# Patient Record
Sex: Female | Born: 1950 | Race: White | Hispanic: No | Marital: Married | Smoking: Never smoker
Health system: Southern US, Community
[De-identification: ages and names within clinical notes are randomized; demographics above are authoritative.]

---

## 2018-04-20 ENCOUNTER — Other Ambulatory Visit: Payer: Self-pay | Admitting: Family Medicine

## 2018-04-20 DIAGNOSIS — N6310 Unspecified lump in the right breast, unspecified quadrant: Secondary | ICD-10-CM

## 2018-04-20 DIAGNOSIS — N6320 Unspecified lump in the left breast, unspecified quadrant: Secondary | ICD-10-CM

## 2018-04-20 DIAGNOSIS — N6489 Other specified disorders of breast: Secondary | ICD-10-CM

## 2018-04-24 ENCOUNTER — Other Ambulatory Visit: Payer: Self-pay | Admitting: Family Medicine

## 2018-04-24 DIAGNOSIS — N63 Unspecified lump in unspecified breast: Secondary | ICD-10-CM

## 2018-04-24 DIAGNOSIS — N6320 Unspecified lump in the left breast, unspecified quadrant: Secondary | ICD-10-CM

## 2018-04-27 ENCOUNTER — Ambulatory Visit
Admission: RE | Admit: 2018-04-27 | Discharge: 2018-04-27 | Disposition: A | Discharge status: Home / Self Care | Payer: Medicare HMO | Source: Ambulatory Visit | Attending: Family Medicine | Admitting: Family Medicine

## 2018-04-27 ENCOUNTER — Other Ambulatory Visit: Payer: Self-pay | Admitting: Family Medicine

## 2018-04-27 DIAGNOSIS — N6320 Unspecified lump in the left breast, unspecified quadrant: Secondary | ICD-10-CM

## 2018-04-27 DIAGNOSIS — N6310 Unspecified lump in the right breast, unspecified quadrant: Secondary | ICD-10-CM

## 2018-04-28 ENCOUNTER — Ambulatory Visit
Admission: RE | Admit: 2018-04-28 | Discharge: 2018-04-28 | Disposition: A | Discharge status: Home / Self Care | Payer: Medicare HMO | Source: Ambulatory Visit | Attending: Family Medicine | Admitting: Family Medicine

## 2018-04-28 ENCOUNTER — Other Ambulatory Visit: Payer: Self-pay | Admitting: Family Medicine

## 2018-04-28 DIAGNOSIS — N63 Unspecified lump in unspecified breast: Secondary | ICD-10-CM

## 2018-04-28 DIAGNOSIS — N6489 Other specified disorders of breast: Secondary | ICD-10-CM

## 2018-04-28 DIAGNOSIS — N6320 Unspecified lump in the left breast, unspecified quadrant: Secondary | ICD-10-CM

## 2018-04-28 DIAGNOSIS — N6310 Unspecified lump in the right breast, unspecified quadrant: Secondary | ICD-10-CM

## 2018-05-05 ENCOUNTER — Ambulatory Visit
Admission: RE | Admit: 2018-05-05 | Discharge: 2018-05-05 | Disposition: A | Discharge status: Home / Self Care | Payer: Medicare HMO | Source: Ambulatory Visit | Attending: Family Medicine | Admitting: Family Medicine

## 2018-05-05 ENCOUNTER — Other Ambulatory Visit: Payer: Self-pay | Admitting: Family Medicine

## 2018-05-05 DIAGNOSIS — N6489 Other specified disorders of breast: Secondary | ICD-10-CM

## 2018-07-03 ENCOUNTER — Telehealth: Payer: Self-pay | Admitting: Hematology & Oncology

## 2018-07-03 ENCOUNTER — Other Ambulatory Visit: Payer: Self-pay

## 2018-07-03 NOTE — Telephone Encounter (Signed)
LMOM FOR PT TO RETURN CALL TO OFFICE TO [facility] NEW PATIENT APPT. APPT LETTER MAILED TO CONFIRM LOCATION/DATE/TIME 08/11/18 AT 1030 AM

## 2018-07-24 ENCOUNTER — Inpatient Hospital Stay: Payer: Medicare HMO | Attending: Hematology & Oncology

## 2018-07-24 ENCOUNTER — Other Ambulatory Visit: Payer: Self-pay

## 2018-07-24 ENCOUNTER — Encounter: Payer: Self-pay | Admitting: Hematology & Oncology

## 2018-07-24 ENCOUNTER — Inpatient Hospital Stay (HOSPITAL_BASED_OUTPATIENT_CLINIC_OR_DEPARTMENT_OTHER): Payer: Medicare HMO | Admitting: Hematology & Oncology

## 2018-07-24 ENCOUNTER — Encounter: Payer: Self-pay | Admitting: *Deleted

## 2018-07-24 VITALS — BP 138/65 | HR 82 | Resp 18 | Ht 61.0 in | Wt 195.0 lb

## 2018-07-24 DIAGNOSIS — Z90710 Acquired absence of both cervix and uterus: Secondary | ICD-10-CM | POA: Diagnosis not present

## 2018-07-24 DIAGNOSIS — Z79899 Other long term (current) drug therapy: Secondary | ICD-10-CM | POA: Insufficient documentation

## 2018-07-24 DIAGNOSIS — Z79811 Long term (current) use of aromatase inhibitors: Secondary | ICD-10-CM | POA: Diagnosis not present

## 2018-07-24 DIAGNOSIS — C50412 Malignant neoplasm of upper-outer quadrant of left female breast: Secondary | ICD-10-CM | POA: Insufficient documentation

## 2018-07-24 DIAGNOSIS — Z170 Estrogen receptor positive status [ER+]: Secondary | ICD-10-CM

## 2018-07-24 DIAGNOSIS — Z7951 Long term (current) use of inhaled steroids: Secondary | ICD-10-CM | POA: Diagnosis not present

## 2018-07-24 DIAGNOSIS — M8589 Other specified disorders of bone density and structure, multiple sites: Secondary | ICD-10-CM

## 2018-07-24 DIAGNOSIS — Z803 Family history of malignant neoplasm of breast: Secondary | ICD-10-CM | POA: Diagnosis not present

## 2018-07-24 DIAGNOSIS — M8580 Other specified disorders of bone density and structure, unspecified site: Secondary | ICD-10-CM | POA: Insufficient documentation

## 2018-07-24 LAB — CBC WITH DIFFERENTIAL (CANCER CENTER ONLY)
Abs Immature Granulocytes: 0.01 10*3/uL (ref 0.00–0.07)
Basophils Absolute: 0.1 10*3/uL (ref 0.0–0.1)
Basophils Relative: 1 %
Eosinophils Absolute: 0.3 10*3/uL (ref 0.0–0.5)
Eosinophils Relative: 4 %
HCT: 41.9 % (ref 36.0–46.0)
Hemoglobin: 13.9 g/dL (ref 12.0–15.0)
Immature Granulocytes: 0 %
Lymphocytes Relative: 22 %
Lymphs Abs: 1.8 10*3/uL (ref 0.7–4.0)
MCH: 33.6 pg (ref 26.0–34.0)
MCHC: 33.2 g/dL (ref 30.0–36.0)
MCV: 101.2 fL — ABNORMAL HIGH (ref 80.0–100.0)
Monocytes Absolute: 0.6 10*3/uL (ref 0.1–1.0)
Monocytes Relative: 8 %
Neutro Abs: 5.3 10*3/uL (ref 1.7–7.7)
Neutrophils Relative %: 65 %
Platelet Count: 277 10*3/uL (ref 150–400)
RBC: 4.14 MIL/uL (ref 3.87–5.11)
RDW: 12.6 % (ref 11.5–15.5)
WBC Count: 8.1 10*3/uL (ref 4.0–10.5)
nRBC: 0 % (ref 0.0–0.2)

## 2018-07-24 LAB — CMP (CANCER CENTER ONLY)
ALT: 17 U/L (ref 0–44)
AST: 16 U/L (ref 15–41)
Albumin: 4.6 g/dL (ref 3.5–5.0)
Alkaline Phosphatase: 100 U/L (ref 38–126)
Anion gap: 10 (ref 5–15)
BUN: 22 mg/dL (ref 8–23)
CO2: 29 mmol/L (ref 22–32)
Calcium: 11.2 mg/dL — ABNORMAL HIGH (ref 8.9–10.3)
Chloride: 101 mmol/L (ref 98–111)
Creatinine: 0.86 mg/dL (ref 0.44–1.00)
GFR, Est AFR Am: >60 mL/min (ref >60)
GFR, Estimated: >60 mL/min (ref >60)
Glucose, Bld: 96 mg/dL (ref 70–99)
Potassium: 3.8 mmol/L (ref 3.5–5.1)
Sodium: 140 mmol/L (ref 135–145)
Total Bilirubin: 0.4 mg/dL (ref 0.3–1.2)
Total Protein: 7.8 g/dL (ref 6.5–8.1)

## 2018-07-24 MED ORDER — LETROZOLE 2.5 MG PO TABS: 2.5000 mg | ORAL_TABLET | Freq: Every day | ORAL | 6 refills | Status: DC

## 2018-07-24 NOTE — Progress Notes (Signed)
.  Initial RN Navigator Patient Visit  Name: Jocelyn Shaw Date of Referral : 07/03/18 Diagnosis: Malignant neoplasm of upper-outer quadrant of left breast in female, estrogen receptor positive ([place])  Met with patient prior to their visit with MD. Hanley Seamen patient "Your Patient Navigator" handout which explains my role, areas in which I am able to help, and all the contact information for myself and the office. Also gave patient MD and Navigator business card. Reviewed with patient the general overview of expected course after initial diagnosis and time frame for all steps to be completed.  Patient completed visit with Dr. Marin Olp  Patient has already completed biopsy, surgery, and referral for radiation. She has been referred here for possible oral therapy. She doesn't require any further work up.  Will continue as needed. Patient knows to reach out to me if any barriers, questions or concerns come up.  Patient understands all follow up procedures and expectations. They have my number to reach out for any further clarification or additional needs. Will call patient in 5-7 days to see if any further needs have presented, or if patient has any further questions or needs.

## 2018-07-24 NOTE — Progress Notes (Signed)
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 New Patient (Initial Visit)  : I had breast cancer in my left breast.  HPI: Jocelyn Shaw is a very nice 68 year old postmenopausal white female.  She is already great-grandmother.  She has 2 great-grandchildren.  There is a strong history of breast cancer in her family.  Her daughter had breast cancer at age 40.  Her mother had breast cancer.  An aunt and sister had breast cancer.  Apparently, her daughter was checked for the BRCA gene and was negative.  Jocelyn Shaw went to her family doctors for a routine visit.  Family doctor noted that the nipple on the left breast was a little bit inverted.  Because of this, a mammogram was ordered.  The mammogram was done 04/16/2018.  In the left breast there was a 1.0 cm mass at the 9 o'clock position.  In the right breast, there is a 1.5 cm indeterminate mass at 11 o'clock position.  She underwent biopsies of both breasts.  Apparently, the biopsies were positive on the left breast and not possible on the right breast.  She then was seen at [facility] and underwent a lumpectomy on 06/09/2018.  The pathology report ([id number]) showed an invasive ductal carcinoma.  The tumor was 1.3 cm in size.  All margins were negative.  She had 2 sentinel lymph nodes that were biopsied and both were negative.  The tumor had a nuclear grade 2 and a glandular tumor grade of 3.  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  The tumor was strongly ER positive and PR positive.  HER-2 negative.  Oncotype testing was then done.  She had a Oncotype score of 16.  She does not want radiation therapy.  She would not take chemotherapy.  She was kindly referred to the [facility] as a niece is 1 of our nurses.  She is doing well.  She feels okay.  There is still  little bit of discomfort with the left breast.  She had her first child when she was 78 years old.  She had a hysterectomy at age 2.  She never smoked.  She does have some osteopenia.  She does take vitamin D and calcium.  Currently, her performance status is ECOG 0.   No past medical history on file.:  :   Current Outpatient Medications:  .  albuterol (PROVENTIL) (2.5 MG/3ML) 0.083% nebulizer solution, Inhale into the lungs., Disp: , Rfl:  .  allopurinol (ZYLOPRIM) 300 MG tablet, Take 300 mg by mouth daily., Disp: , Rfl:  .  azithromycin (ZITHROMAX) 250 MG tablet, Take 2 tablets (500 mg) on  Day 1,  followed by 1 tablet (250 mg) once daily on Days 2 through 5., Disp: , Rfl:  .  bimatoprost (LUMIGAN) 0.01 % SOLN, nightly. One drop in each eye nightly., Disp: , Rfl:  .  brimonidine (ALPHAGAN P) 0.1 % SOLN, 1 drop 3 (three) times a day., Disp: , Rfl:  .  brinzolamide (AZOPT) 1 % ophthalmic suspension, 1 drop 3 (three) times a day., Disp: , Rfl:  .  Brinzolamide-Brimonidine 1-0.2 % SUSP, 1 drop 3 (three) times a day., Disp: , Rfl:  .  budesonide-formoterol (SYMBICORT) 160-4.5 MCG/ACT inhaler, Frequency:   Dosage:0.0     Instructions:  Note:, Disp: , Rfl:  .  calcipotriene (DOVONOX) 0.005 % cream, , Disp: , Rfl:  .  Calcium Carbonate-Vitamin D3 600-400 MG-UNIT TABS, Take by mouth., Disp: , Rfl:  .  fluticasone (FLOVENT DISKUS) 50 MCG/BLIST diskus inhaler, Inhale into the lungs., Disp: , Rfl:  .  fluticasone furoate-vilanterol (BREO ELLIPTA) 100-25 MCG/INH AEPB, INHALE 1 PUFF BY MOUTH EVERY DAY, Disp: , Rfl:  .  hydrochlorothiazide (HYDRODIURIL) 12.5 MG tablet, Take by mouth., Disp: , Rfl:  .  HYDROcodone-acetaminophen (NORCO/VICODIN) 5-325 MG tablet, TAKE 1 TABLET BY MOUTH EVERY 6 (SIX) HOURS AS NEEDED FOR UP TO 5 DAYS., Disp: , Rfl:  .  HYDROcodone-homatropine (HYCODAN) 5-1.5 MG/5ML syrup, TAKE 2.5 MLS BY MOUTH EVERY 8 (EIGHT) HOURS AS NEEDED FOR UP TO 5 DAYS., Disp: , Rfl:  .   hydrOXYzine (ATARAX/VISTARIL) 25 MG tablet, Take by mouth., Disp: , Rfl:  .  KRILL OIL PO, , Disp: , Rfl:  .  losartan (COZAAR) 100 MG tablet, TAKE 1 TABLET BY MOUTH DAILY, Disp: , Rfl:  .  montelukast (SINGULAIR) 10 MG tablet, Take by mouth., Disp: , Rfl:  .  Multiple Vitamin (MULTI-VITAMINS) TABS, Take by mouth., Disp: , Rfl:  .  Omega-3 Fatty Acids (OMEGA 3 PO), Take by mouth., Disp: , Rfl:  .  Omeprazole 20 MG TBDD, Take by mouth., Disp: , Rfl:  .  ranitidine (ZANTAC) 150 MG tablet, Frequency:   Dosage:0.0     Instructions:  Note:, Disp: , Rfl:  .  tiotropium (SPIRIVA) 18 MCG inhalation capsule, Frequency:   Dosage:0.0     Instructions:  Note:, Disp: , Rfl:  .  valACYclovir (VALTREX) 1000 MG tablet, Take by mouth., Disp: , Rfl: :  :  Allergies  Allergen Reactions  . Penicillins Rash and Swelling  . Levofloxacin Other (See Comments) and Swelling    Pt states causes joint pain   . Doxycycline Hyclate Rash  . Sulfa Antibiotics Rash  :  No family history on file.:  Social History   Socioeconomic History  . Marital status: Married    Spouse name: Not on file  . Number of children: Not on file  . Years of education: Not on file  . Highest education level: Not on file  Occupational History  . Not on file  Social Needs  . Financial resource strain: Not on file  . Food insecurity:    Worry: Not on file    Inability: Not on file  . Transportation needs:    Medical: Not on file    Non-medical: Not on file  Tobacco Use  . Smoking status: Never Smoker  . Smokeless tobacco: Never Used  Substance and Sexual Activity  . Alcohol use: Not Currently  . Drug use: Never  . Sexual activity: Not Currently  Lifestyle  . Physical activity:    Days per week: Not on file    Minutes per session: Not on file  . Stress: Not on file  Relationships  . Social connections:    Talks on phone: Not on file    Gets together: Not on file    Attends religious service: Not on file    Active  member of club or organization: Not on file    Attends meetings of clubs or organizations: Not on file    Relationship status: Not on file  . Intimate partner violence:    Fear of current or ex partner: Not on file    Emotionally abused: Not on file    Physically abused: Not on file  Forced sexual activity: Not on file  Other Topics Concern  . Not on file  Social History Narrative  . Not on file  :  Review of Systems  Constitutional: Negative.   HENT: Negative.   Eyes: Negative.   Respiratory: Negative.   Cardiovascular: Negative.   Gastrointestinal: Negative.   Genitourinary: Negative.   Musculoskeletal: Negative.   Skin: Negative.   Neurological: Negative.   Endo/Heme/Allergies: Negative.   Psychiatric/Behavioral: Negative.      Exam: Well-developed well-nourished white female in no obvious distress.  Vital signs show temperature of 98.  Pulse 82.  Blood pressure 138/65.  Weight is 195 pounds.  Head exam shows no ocular or oral lesions.  She has no palpable cervical or supraclavicular lymph nodes.  Lungs are clear bilaterally.  Cardiac exam regular rate and rhythm with no murmurs, rubs or bruits.  Breast exam shows right breast with no masses, edema or erythema.  There is no right axillary adenopathy.  Blood pressure healed lobectomy scar at about the 8 o'clock position.  There is firmness at the lumpectomy site.  There is no distinct mass noted.  The left sentinel node biopsy scar is well healing.  There is no axillary adenopathy in the left axilla.  Abdomen is soft.  She is somewhat obese.  She has no fluid wave.  There is no palpable mass.  She has good bowel sounds.  There is no palpable liver or spleen tip.  Extremities shows no clubbing, cyanosis or edema.  There is no lymphedema in the left arm.  She has good strength in upper and lower extremities.  Back exam shows no tenderness over the spine, ribs or hips.  Skin exam shows no rashes, ecchymoses or petechia.  Neurological  exam shows no focal neurological deficits. '@IPVITALS'$ @   Recent Labs    07/24/18 1352  WBC 8.1  HGB 13.9  HCT 41.9  PLT 277   Recent Labs    07/24/18 1352  NA 140  K 3.8  CL 101  CO2 29  GLUCOSE 96  BUN 22  CREATININE 0.86  CALCIUM 11.2*    Blood smear review: None  Pathology: See above    Assessment and Plan: Jocelyn Shaw is a 68 year old white female.  She has a stage Ic infiltrating ductal carcinoma of the left breast.  She had a lumpectomy.  She just will not take radiation therapy.  I tried to explain to her that radiation therapy is definitely beneficial for women who had lumpectomies.  She just does not think that radiation is that helpful for her given that she has stage I breast cancer.  She is postmenopausal.  ER positive so we will put her on Femara.  I think this is reasonable.  I think she should tolerate this very well.  I spent about an hour with her today.  I spent all the time face-to-face with her..  I went over the pathology with her.  I went over the Oncotype score with her.  I answered all of her questions.  I will send in the Femara prescription for her.  I would like to see her back in 6 weeks and we will see how she is doing.

## 2018-07-27 LAB — LACTATE DEHYDROGENASE: LDH: 169 U/L (ref 98–192)

## 2018-07-31 ENCOUNTER — Encounter: Payer: Self-pay | Admitting: *Deleted

## 2018-07-31 NOTE — Progress Notes (Signed)
Reached out to patient to follow up after her new patient appointment last week, to see if she had any questions or concerns. Also wanted to make sure she had her Femara filled and started treatment   Wednesday she had a rough day. She experienced achy joints, extremely fatigued, and difficulty sleeping. She says she has felt fine since and wonders if it was the medicine or even a 'bug'. Educated the patient that these side effects could definitely be from the medication, but many time they were short lived. Also informed her of possible night sweats and mood changes. Instructed patient to keep track of any symptoms she was having so she could report these to Dr Marin Olp at her next visit. Also instructed patient that if any side effect or symptom became difficult to tolerate or if she felt poorly for multiple days, to call the office and not wait for her next appointment. She understood.  She had no questions or follow up concerns from her appointment. She has my contact info if needed.  Marland Kitchen

## 2018-08-11 ENCOUNTER — Ambulatory Visit: Payer: Medicare HMO | Admitting: Hematology & Oncology

## 2018-08-11 ENCOUNTER — Other Ambulatory Visit: Payer: Medicare HMO

## 2018-09-08 ENCOUNTER — Inpatient Hospital Stay: Payer: Medicare HMO | Attending: Hematology & Oncology | Admitting: Hematology & Oncology

## 2018-09-08 ENCOUNTER — Other Ambulatory Visit: Payer: Self-pay

## 2018-09-08 ENCOUNTER — Encounter: Payer: Self-pay | Admitting: *Deleted

## 2018-09-08 ENCOUNTER — Inpatient Hospital Stay: Payer: Medicare HMO

## 2018-09-08 VITALS — BP 149/76 | HR 84 | Temp 98.5°F | Resp 17 | Wt 193.0 lb

## 2018-09-08 DIAGNOSIS — C50412 Malignant neoplasm of upper-outer quadrant of left female breast: Secondary | ICD-10-CM | POA: Diagnosis not present

## 2018-09-08 DIAGNOSIS — Z7951 Long term (current) use of inhaled steroids: Secondary | ICD-10-CM | POA: Diagnosis not present

## 2018-09-08 DIAGNOSIS — Z79899 Other long term (current) drug therapy: Secondary | ICD-10-CM | POA: Diagnosis not present

## 2018-09-08 DIAGNOSIS — Z170 Estrogen receptor positive status [ER+]: Secondary | ICD-10-CM

## 2018-09-08 DIAGNOSIS — M8589 Other specified disorders of bone density and structure, multiple sites: Secondary | ICD-10-CM | POA: Insufficient documentation

## 2018-09-08 DIAGNOSIS — Z79811 Long term (current) use of aromatase inhibitors: Secondary | ICD-10-CM | POA: Diagnosis not present

## 2018-09-08 LAB — CMP (CANCER CENTER ONLY)
ALT: 20 U/L (ref 0–44)
AST: 17 U/L (ref 15–41)
Albumin: 4.8 g/dL (ref 3.5–5.0)
Alkaline Phosphatase: 95 U/L (ref 38–126)
Anion gap: 10 (ref 5–15)
BUN: 23 mg/dL (ref 8–23)
CO2: 31 mmol/L (ref 22–32)
Calcium: 10.7 mg/dL — ABNORMAL HIGH (ref 8.9–10.3)
Chloride: 97 mmol/L — ABNORMAL LOW (ref 98–111)
Creatinine: 0.97 mg/dL (ref 0.44–1.00)
GFR, Est AFR Am: >60 mL/min (ref >60)
GFR, Estimated: >60 mL/min (ref >60)
Glucose, Bld: 101 mg/dL — ABNORMAL HIGH (ref 70–99)
Potassium: 3.8 mmol/L (ref 3.5–5.1)
Sodium: 138 mmol/L (ref 135–145)
Total Bilirubin: 0.5 mg/dL (ref 0.3–1.2)
Total Protein: 7.4 g/dL (ref 6.5–8.1)

## 2018-09-08 LAB — CBC WITH DIFFERENTIAL (CANCER CENTER ONLY)
Abs Immature Granulocytes: 0.02 10*3/uL (ref 0.00–0.07)
Basophils Absolute: 0.1 10*3/uL (ref 0.0–0.1)
Basophils Relative: 1 %
Eosinophils Absolute: 0.3 10*3/uL (ref 0.0–0.5)
Eosinophils Relative: 4 %
HCT: 44 % (ref 36.0–46.0)
Hemoglobin: 14.6 g/dL (ref 12.0–15.0)
Immature Granulocytes: 0 %
Lymphocytes Relative: 22 %
Lymphs Abs: 1.7 10*3/uL (ref 0.7–4.0)
MCH: 33.2 pg (ref 26.0–34.0)
MCHC: 33.2 g/dL (ref 30.0–36.0)
MCV: 100 fL (ref 80.0–100.0)
Monocytes Absolute: 0.6 10*3/uL (ref 0.1–1.0)
Monocytes Relative: 8 %
Neutro Abs: 5.1 10*3/uL (ref 1.7–7.7)
Neutrophils Relative %: 65 %
Platelet Count: 287 10*3/uL (ref 150–400)
RBC: 4.4 MIL/uL (ref 3.87–5.11)
RDW: 12.6 % (ref 11.5–15.5)
WBC Count: 7.8 10*3/uL (ref 4.0–10.5)
nRBC: 0 % (ref 0.0–0.2)

## 2018-09-08 NOTE — Progress Notes (Signed)
Hematology and Oncology Follow Up Visit  Jocelyn Shaw [id number] [birth year] 68 y.o. 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  We first saw her back in May.  We do have her on Femara.  She is doing well on the Femara so far.  She did have some problems with respect to the lumpectomy site.  She had to go back to [place].  She had this opened up.  It was drained.  There was infection and there.  She had to pack it.  It finally has gotten to the point where there is very little if any discharge.  She has not been able to see her surgeon at [facility].  She is seen in the PAs that work with him.  She really wishes to be able to see the surgeon.  She has had no fever.  She has had no cough or shortness of breath.  There is been no nausea or vomiting.  She has had no change in bowel or bladder habits.  She has had some hot flashes.  This is from the [place].  She does not want anything for this right now.  She still does not feel that radiation therapy is going to be helpful for her.  She has had no rashes.  She has had no leg swelling.  Overall, her performance status is ECOG 1.  Medications:  Current Outpatient Medications:  .  albuterol (PROVENTIL) (2.5 MG/3ML) 0.083% nebulizer solution, Inhale into the lungs., Disp: , Rfl:  .  allopurinol (ZYLOPRIM) 300 MG tablet, Take 300 mg by mouth daily., Disp: , Rfl:  .  azithromycin (ZITHROMAX) 250 MG tablet, Take 2 tablets (500 mg) on  Day 1,  followed by 1 tablet (250 mg) once daily on Days 2 through 5., Disp: , Rfl:  .  bimatoprost (LUMIGAN) 0.01 % SOLN, nightly. One drop in each eye nightly., Disp: , Rfl:  .  brimonidine (ALPHAGAN P) 0.1 % SOLN, 1 drop 3 (three) times a day., Disp: , Rfl:  .  brinzolamide (AZOPT) 1 %  ophthalmic suspension, 1 drop 3 (three) times a day., Disp: , Rfl:  .  Brinzolamide-Brimonidine 1-0.2 % SUSP, 1 drop 3 (three) times a day., Disp: , Rfl:  .  budesonide-formoterol (SYMBICORT) 160-4.5 MCG/ACT inhaler, Frequency:   Dosage:0.0     Instructions:  Note:, Disp: , Rfl:  .  calcipotriene (DOVONOX) 0.005 % cream, , Disp: , Rfl:  .  Calcium Carbonate-Vitamin D3 600-400 MG-UNIT TABS, Take by mouth., Disp: , Rfl:  .  fluticasone (FLOVENT DISKUS) 50 MCG/BLIST diskus inhaler, Inhale into the lungs., Disp: , Rfl:  .  fluticasone furoate-vilanterol (BREO ELLIPTA) 100-25 MCG/INH AEPB, INHALE 1 PUFF BY MOUTH EVERY DAY, Disp: , Rfl:  .  hydrochlorothiazide (HYDRODIURIL) 12.5 MG tablet, Take by mouth., Disp: , Rfl:  .  HYDROcodone-acetaminophen (NORCO/VICODIN) 5-325 MG tablet, TAKE 1 TABLET BY MOUTH EVERY 6 (SIX) HOURS AS NEEDED FOR UP TO 5 DAYS., Disp: , Rfl:  .  HYDROcodone-homatropine (HYCODAN) 5-1.5 MG/5ML syrup, TAKE 2.5 MLS BY MOUTH EVERY 8 (EIGHT) HOURS AS NEEDED FOR UP TO 5 DAYS., Disp: , Rfl:  .  hydrOXYzine (ATARAX/VISTARIL) 25 MG tablet, Take by mouth., Disp: , Rfl:  .  KRILL OIL PO, ,  Disp: , Rfl:  .  letrozole (FEMARA) 2.5 MG tablet, Take 1 tablet (2.5 mg total) by mouth daily., Disp: 30 tablet, Rfl: 6 .  losartan (COZAAR) 100 MG tablet, TAKE 1 TABLET BY MOUTH DAILY, Disp: , Rfl:  .  montelukast (SINGULAIR) 10 MG tablet, Take by mouth., Disp: , Rfl:  .  Multiple Vitamin (MULTI-VITAMINS) TABS, Take by mouth., Disp: , Rfl:  .  Omega-3 Fatty Acids (OMEGA 3 PO), Take by mouth., Disp: , Rfl:  .  Omeprazole 20 MG TBDD, Take by mouth., Disp: , Rfl:  .  ranitidine (ZANTAC) 150 MG tablet, Frequency:   Dosage:0.0     Instructions:  Note:, Disp: , Rfl:  .  tiotropium (SPIRIVA) 18 MCG inhalation capsule, Frequency:   Dosage:0.0     Instructions:  Note:, Disp: , Rfl:  .  valACYclovir (VALTREX) 1000 MG tablet, Take by mouth., Disp: , Rfl:   Allergies:  Allergies  Allergen Reactions  .  Penicillins Rash and Swelling  . Levofloxacin Other (See Comments) and Swelling    Pt states causes joint pain   . Doxycycline Hyclate Rash  . Sulfa Antibiotics Rash    Past Medical History, Surgical history, Social history, and Family History were reviewed and updated.  Review of Systems: Review of Systems  Constitutional: Negative.   HENT:  Negative.   Eyes: Negative.   Respiratory: Negative.   Cardiovascular: Negative.   Gastrointestinal: Negative.   Endocrine: Negative.   Genitourinary: Negative.    Musculoskeletal: Negative.   Skin: Negative.   Neurological: Negative.   Hematological: Negative.   Psychiatric/Behavioral: Negative.     Physical Exam:  weight is 193 lb (87.5 kg). Her oral temperature is 98.5 F (36.9 C). Her blood pressure is 149/76 (abnormal) and her pulse is 84. Her respiration is 17 and oxygen saturation is 97%.   Wt Readings from Last 3 Encounters:  09/08/18 193 lb (87.5 kg)  07/24/18 195 lb (88.5 kg)    Physical Exam Vitals signs reviewed.  Constitutional:      Comments: Breast exam shows right breast with no masses, edema or erythema.  There is no right axillary adenopathy.  Her left breast has a lumpectomy scar at about the 11 o'clock position.  This is a very tiny area of eschar on it.  There is no surrounding erythema.  There is no fluctuance.  There is no tenderness.  She has no nipple discharge.  There is no left axillary adenopathy.  HENT:     Head: Normocephalic and atraumatic.  Eyes:     Pupils: Pupils are equal, round, and reactive to light.  Neck:     Musculoskeletal: Normal range of motion.  Cardiovascular:     Rate and Rhythm: Normal rate and regular rhythm.     Heart sounds: Normal heart sounds.  Pulmonary:     Effort: Pulmonary effort is normal.     Breath sounds: Normal breath sounds.  Abdominal:     General: Bowel sounds are normal.     Palpations: Abdomen is soft.  Musculoskeletal: Normal range of motion.         General: No tenderness or deformity.  Lymphadenopathy:     Cervical: No cervical adenopathy.  Skin:    General: Skin is warm and dry.     Findings: No erythema or rash.  Neurological:     Mental Status: She is alert and oriented to person, place, and time.  Psychiatric:        Behavior: Behavior normal.  Thought Content: Thought content normal.        Judgment: Judgment normal.      Lab Results  Component Value Date   WBC 7.8 09/08/2018   HGB 14.6 09/08/2018   HCT 44.0 09/08/2018   MCV 100.0 09/08/2018   PLT 287 09/08/2018     Chemistry      Component Value Date/Time   NA 138 09/08/2018 0910   K 3.8 09/08/2018 0910   CL 97 (L) 09/08/2018 0910   CO2 31 09/08/2018 0910   BUN 23 09/08/2018 0910   CREATININE 0.97 09/08/2018 0910      Component Value Date/Time   CALCIUM 10.7 (H) 09/08/2018 0910   ALKPHOS 95 09/08/2018 0910   AST 17 09/08/2018 0910   ALT 20 09/08/2018 0910   BILITOT 0.5 09/08/2018 0910      Impression and Plan: Jocelyn Shaw is a 68 year old postmenopausal female.  She has a stage Ic ductal carcinoma of the left breast.  She had a lumpectomy.  She did not wish to have any radiation therapy.  We have her on Femara.  She had the lumpectomy site opened up and now it is healing nicely.  For right now, I really do not think she need anything else to be done with this lobectomy wound.  It looks like it is healing up quite nicely.  I will continue her on the Femara.  We will plan to see her back in 3 months now.  I think this would be very reasonable.  I did spend about 35 minutes with her today.  I had to spend extra time with her because of this lobectomy his incision infection and talk her through my recommendations.   Volanda Napoleon, MD 6/23/202010:33 AM

## 2018-09-09 LAB — VITAMIN D 25 HYDROXY (VIT D DEFICIENCY, FRACTURES): Vit D, 25-Hydroxy: 35.2 ng/mL (ref 30.0–100.0)

## 2018-12-08 ENCOUNTER — Inpatient Hospital Stay: Payer: Medicare HMO | Attending: Hematology & Oncology | Admitting: Hematology & Oncology

## 2018-12-08 ENCOUNTER — Inpatient Hospital Stay: Payer: Medicare HMO

## 2018-12-08 ENCOUNTER — Other Ambulatory Visit: Payer: Self-pay

## 2018-12-08 ENCOUNTER — Encounter: Payer: Self-pay | Admitting: Hematology & Oncology

## 2018-12-08 VITALS — BP 152/77 | HR 82 | Temp 97.7°F | Wt 193.8 lb

## 2018-12-08 DIAGNOSIS — C50412 Malignant neoplasm of upper-outer quadrant of left female breast: Secondary | ICD-10-CM

## 2018-12-08 DIAGNOSIS — Z79899 Other long term (current) drug therapy: Secondary | ICD-10-CM | POA: Insufficient documentation

## 2018-12-08 DIAGNOSIS — Z170 Estrogen receptor positive status [ER+]: Secondary | ICD-10-CM | POA: Insufficient documentation

## 2018-12-08 DIAGNOSIS — Z7951 Long term (current) use of inhaled steroids: Secondary | ICD-10-CM | POA: Insufficient documentation

## 2018-12-08 DIAGNOSIS — Z79811 Long term (current) use of aromatase inhibitors: Secondary | ICD-10-CM | POA: Diagnosis not present

## 2018-12-08 DIAGNOSIS — R197 Diarrhea, unspecified: Secondary | ICD-10-CM | POA: Diagnosis not present

## 2018-12-08 DIAGNOSIS — M8589 Other specified disorders of bone density and structure, multiple sites: Secondary | ICD-10-CM

## 2018-12-08 LAB — CMP (CANCER CENTER ONLY)
ALT: 29 U/L (ref 0–44)
AST: 21 U/L (ref 15–41)
Albumin: 4.5 g/dL (ref 3.5–5.0)
Alkaline Phosphatase: 99 U/L (ref 38–126)
Anion gap: 9 (ref 5–15)
BUN: 16 mg/dL (ref 8–23)
CO2: 32 mmol/L (ref 22–32)
Calcium: 10.4 mg/dL — ABNORMAL HIGH (ref 8.9–10.3)
Chloride: 101 mmol/L (ref 98–111)
Creatinine: 0.98 mg/dL (ref 0.44–1.00)
GFR, Est AFR Am: >60 mL/min (ref >60)
GFR, Estimated: 59 mL/min — ABNORMAL LOW (ref >60)
Glucose, Bld: 106 mg/dL — ABNORMAL HIGH (ref 70–99)
Potassium: 4.8 mmol/L (ref 3.5–5.1)
Sodium: 142 mmol/L (ref 135–145)
Total Bilirubin: 0.5 mg/dL (ref 0.3–1.2)
Total Protein: 7.9 g/dL (ref 6.5–8.1)

## 2018-12-08 LAB — CBC WITH DIFFERENTIAL (CANCER CENTER ONLY)
Abs Immature Granulocytes: 0.03 10*3/uL (ref 0.00–0.07)
Basophils Absolute: 0.1 10*3/uL (ref 0.0–0.1)
Basophils Relative: 1 %
Eosinophils Absolute: 0.3 10*3/uL (ref 0.0–0.5)
Eosinophils Relative: 3 %
HCT: 42.8 % (ref 36.0–46.0)
Hemoglobin: 14.3 g/dL (ref 12.0–15.0)
Immature Granulocytes: 0 %
Lymphocytes Relative: 22 %
Lymphs Abs: 1.6 10*3/uL (ref 0.7–4.0)
MCH: 32.9 pg (ref 26.0–34.0)
MCHC: 33.4 g/dL (ref 30.0–36.0)
MCV: 98.4 fL (ref 80.0–100.0)
Monocytes Absolute: 0.5 10*3/uL (ref 0.1–1.0)
Monocytes Relative: 7 %
Neutro Abs: 4.9 10*3/uL (ref 1.7–7.7)
Neutrophils Relative %: 67 %
Platelet Count: 280 10*3/uL (ref 150–400)
RBC: 4.35 MIL/uL (ref 3.87–5.11)
RDW: 13.2 % (ref 11.5–15.5)
WBC Count: 7.4 10*3/uL (ref 4.0–10.5)
nRBC: 0 % (ref 0.0–0.2)

## 2018-12-08 NOTE — Progress Notes (Signed)
Hematology and Oncology Follow Up Visit  Jocelyn Shaw [id number] 05/25/1950 68 y.o. 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  She is doing okay.  She really has had no complaints outside of the fact that she has had bad hot flashes.  This started with the Femara.  She really does not like taking medications.  I offered her some Megace.  She would like to just hold off on this for right now.  Her left breast is looking very good.  As it sounds like the infection that she had and there is all cleaned up.  She has had some achiness.  She has been taking a nonsteroidal for this.  I told her I did not did not see a problem with this.  She just needs to make sure she takes it with food.  She has had no cough.  There is been no nausea or vomiting.  She is had a little bit of diarrhea which is chronic.    Overall, her performance status is ECOG 1.  Medications:  Current Outpatient Medications:  .  albuterol (PROVENTIL) (2.5 MG/3ML) 0.083% nebulizer solution, Inhale into the lungs., Disp: , Rfl:  .  allopurinol (ZYLOPRIM) 300 MG tablet, Take 300 mg by mouth daily., Disp: , Rfl:  .  bimatoprost (LUMIGAN) 0.01 % SOLN, nightly. One drop in each eye nightly., Disp: , Rfl:  .  Calcium Carbonate-Vitamin D3 600-400 MG-UNIT TABS, Take by mouth., Disp: , Rfl:  .  fluticasone furoate-vilanterol (BREO ELLIPTA) 100-25 MCG/INH AEPB, INHALE 1 PUFF BY MOUTH EVERY DAY, Disp: , Rfl:  .  hydrochlorothiazide (HYDRODIURIL) 12.5 MG tablet, Take by mouth., Disp: , Rfl:  .  hydrOXYzine (ATARAX/VISTARIL) 25 MG tablet, Take by mouth., Disp: , Rfl:  .  KRILL OIL PO, , Disp: , Rfl:  .  letrozole (FEMARA) 2.5 MG tablet, Take 1 tablet (2.5 mg total) by mouth daily., Disp: 30 tablet, Rfl: 6 .  montelukast  (SINGULAIR) 10 MG tablet, Take by mouth., Disp: , Rfl:  .  Multiple Vitamin (MULTI-VITAMINS) TABS, Take by mouth., Disp: , Rfl:  .  Omega-3 Fatty Acids (OMEGA 3 PO), Take by mouth., Disp: , Rfl:  .  Omeprazole 20 MG TBDD, Take by mouth., Disp: , Rfl:  .  ranitidine (ZANTAC) 150 MG tablet, Frequency:   Dosage:0.0     Instructions:  Note:, Disp: , Rfl:  .  valACYclovir (VALTREX) 1000 MG tablet, Take by mouth., Disp: , Rfl:   Allergies:  Allergies  Allergen Reactions  . Penicillins Rash and Swelling  . Levofloxacin Other (See Comments) and Swelling    Pt states causes joint pain   . Doxycycline Hyclate Rash  . Sulfa Antibiotics Rash    Past Medical History, Surgical history, Social history, and Family History were reviewed and updated.  Review of Systems: Review of Systems  Constitutional: Negative.   HENT:  Negative.   Eyes: Negative.   Respiratory: Negative.   Cardiovascular: Negative.   Gastrointestinal: Negative.   Endocrine: Negative.   Genitourinary: Negative.    Musculoskeletal: Negative.   Skin: Negative.   Neurological: Negative.   Hematological: Negative.   Psychiatric/Behavioral: Negative.     Physical Exam:  weight is 193 lb 12.8 oz (87.9 kg). Her oral  temperature is 97.7 F (36.5 C). Her blood pressure is 152/77 (abnormal) and her pulse is 82. Her oxygen saturation is 100%.   Wt Readings from Last 3 Encounters:  12/08/18 193 lb 12.8 oz (87.9 kg)  09/08/18 193 lb (87.5 kg)  07/24/18 195 lb (88.5 kg)    Physical Exam Vitals signs reviewed.  Constitutional:      Comments: Breast exam shows right breast with no masses, edema or erythema.  There is no right axillary adenopathy.  Her left breast has a lumpectomy scar at about the 11 o'clock position.  This is a very tiny area of eschar on it.  There is no surrounding erythema.  There is no fluctuance.  There is no tenderness.  She has no nipple discharge.  There is no left axillary adenopathy.  HENT:     Head:  Normocephalic and atraumatic.  Eyes:     Pupils: Pupils are equal, round, and reactive to light.  Neck:     Musculoskeletal: Normal range of motion.  Cardiovascular:     Rate and Rhythm: Normal rate and regular rhythm.     Heart sounds: Normal heart sounds.  Pulmonary:     Effort: Pulmonary effort is normal.     Breath sounds: Normal breath sounds.  Abdominal:     General: Bowel sounds are normal.     Palpations: Abdomen is soft.  Musculoskeletal: Normal range of motion.        General: No tenderness or deformity.  Lymphadenopathy:     Cervical: No cervical adenopathy.  Skin:    General: Skin is warm and dry.     Findings: No erythema or rash.  Neurological:     Mental Status: She is alert and oriented to person, place, and time.  Psychiatric:        Behavior: Behavior normal.        Thought Content: Thought content normal.        Judgment: Judgment normal.      Lab Results  Component Value Date   WBC 7.4 12/08/2018   HGB 14.3 12/08/2018   HCT 42.8 12/08/2018   MCV 98.4 12/08/2018   PLT 280 12/08/2018     Chemistry      Component Value Date/Time   NA 138 09/08/2018 0910   K 3.8 09/08/2018 0910   CL 97 (L) 09/08/2018 0910   CO2 31 09/08/2018 0910   BUN 23 09/08/2018 0910   CREATININE 0.97 09/08/2018 0910      Component Value Date/Time   CALCIUM 10.7 (H) 09/08/2018 0910   ALKPHOS 95 09/08/2018 0910   AST 17 09/08/2018 0910   ALT 20 09/08/2018 0910   BILITOT 0.5 09/08/2018 0910      Impression and Plan: Jocelyn Shaw is a 68 year old postmenopausal female.  She has a stage Ic ductal carcinoma of the left breast.  She had a lumpectomy.  She did not wish to have any radiation therapy.  We have her on Femara.  I do not see any problems with respect to the left breast.  The lumpectomy site looks nice and clean and dry.  I think we can probably get her back after all the holidays now.  I think this would be reasonable.  Again, if the hot flashes become a  problem, we can add Megace.  Jocelyn Napoleon, MD 9/22/20209:30 AM

## 2018-12-09 LAB — VITAMIN D 25 HYDROXY (VIT D DEFICIENCY, FRACTURES): Vit D, 25-Hydroxy: 39.4 ng/mL (ref 30.0–100.0)

## 2019-03-23 ENCOUNTER — Encounter: Payer: Self-pay | Admitting: Hematology & Oncology

## 2019-03-23 ENCOUNTER — Other Ambulatory Visit: Payer: Self-pay

## 2019-03-23 ENCOUNTER — Inpatient Hospital Stay: Payer: Medicare HMO

## 2019-03-23 ENCOUNTER — Inpatient Hospital Stay: Payer: Medicare HMO | Attending: Hematology & Oncology | Admitting: Hematology & Oncology

## 2019-03-23 VITALS — BP 145/70 | HR 80 | Temp 97.1°F | Resp 18 | Wt 191.5 lb

## 2019-03-23 DIAGNOSIS — R232 Flushing: Secondary | ICD-10-CM | POA: Insufficient documentation

## 2019-03-23 DIAGNOSIS — Z79811 Long term (current) use of aromatase inhibitors: Secondary | ICD-10-CM | POA: Diagnosis not present

## 2019-03-23 DIAGNOSIS — Z170 Estrogen receptor positive status [ER+]: Secondary | ICD-10-CM

## 2019-03-23 DIAGNOSIS — C50412 Malignant neoplasm of upper-outer quadrant of left female breast: Secondary | ICD-10-CM

## 2019-03-23 DIAGNOSIS — Z79899 Other long term (current) drug therapy: Secondary | ICD-10-CM | POA: Diagnosis not present

## 2019-03-23 DIAGNOSIS — Z7951 Long term (current) use of inhaled steroids: Secondary | ICD-10-CM | POA: Diagnosis not present

## 2019-03-23 LAB — CBC WITH DIFFERENTIAL (CANCER CENTER ONLY)
Abs Immature Granulocytes: 0.03 10*3/uL (ref 0.00–0.07)
Basophils Absolute: 0.1 10*3/uL (ref 0.0–0.1)
Basophils Relative: 1 %
Eosinophils Absolute: 0.2 10*3/uL (ref 0.0–0.5)
Eosinophils Relative: 3 %
HCT: 43.9 % (ref 36.0–46.0)
Hemoglobin: 14.6 g/dL (ref 12.0–15.0)
Immature Granulocytes: 0 %
Lymphocytes Relative: 21 %
Lymphs Abs: 1.5 10*3/uL (ref 0.7–4.0)
MCH: 32.4 pg (ref 26.0–34.0)
MCHC: 33.3 g/dL (ref 30.0–36.0)
MCV: 97.6 fL (ref 80.0–100.0)
Monocytes Absolute: 0.5 10*3/uL (ref 0.1–1.0)
Monocytes Relative: 7 %
Neutro Abs: 5.1 10*3/uL (ref 1.7–7.7)
Neutrophils Relative %: 68 %
Platelet Count: 293 10*3/uL (ref 150–400)
RBC: 4.5 MIL/uL (ref 3.87–5.11)
RDW: 13.1 % (ref 11.5–15.5)
WBC Count: 7.4 10*3/uL (ref 4.0–10.5)
nRBC: 0 % (ref 0.0–0.2)

## 2019-03-23 LAB — CMP (CANCER CENTER ONLY)
ALT: 28 U/L (ref 0–44)
AST: 22 U/L (ref 15–41)
Albumin: 4.6 g/dL (ref 3.5–5.0)
Alkaline Phosphatase: 91 U/L (ref 38–126)
Anion gap: 8 (ref 5–15)
BUN: 21 mg/dL (ref 8–23)
CO2: 30 mmol/L (ref 22–32)
Calcium: 10.3 mg/dL (ref 8.9–10.3)
Chloride: 101 mmol/L (ref 98–111)
Creatinine: 0.86 mg/dL (ref 0.44–1.00)
GFR, Est AFR Am: >60 mL/min (ref >60)
GFR, Estimated: >60 mL/min (ref >60)
Glucose, Bld: 104 mg/dL — ABNORMAL HIGH (ref 70–99)
Potassium: 4.6 mmol/L (ref 3.5–5.1)
Sodium: 139 mmol/L (ref 135–145)
Total Bilirubin: 0.5 mg/dL (ref 0.3–1.2)
Total Protein: 7.5 g/dL (ref 6.5–8.1)

## 2019-03-23 MED ORDER — MEGESTROL ACETATE 20 MG PO TABS: 20.0000 mg | ORAL_TABLET | Freq: Every day | ORAL | 6 refills | Status: DC

## 2019-03-23 NOTE — Progress Notes (Signed)
Hematology and Oncology Follow Up Visit  Jocelyn Shaw [id number] [birth year] 69 y.o. 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  She is doing okay.  She now is a great grandmother of a second set of twin girls.  She has a older set of great granddaughters that are also twins.  I must say that this is incredibly rare.  She is bothered by hot flashes.  I will see about getting her on some low-dose Megace.  Hopefully this might help with the hot flashes.  She has had some skin lesions.  I am unsure if this is from the Femara.  I told her that she probably needs to take a little more vitamin D.  I told her to take at least 2 or 3000 units of vitamin D a day.  She has had no problems with bowels or bladder.  She has had no cough.  There is been no leg swelling.  She has had no headache.  There is been no bleeding.  Overall, her performance status is ECOG 1.  Medications:  Current Outpatient Medications:  .  albuterol (PROVENTIL) (2.5 MG/3ML) 0.083% nebulizer solution, Inhale into the lungs., Disp: , Rfl:  .  allopurinol (ZYLOPRIM) 300 MG tablet, Take 300 mg by mouth daily., Disp: , Rfl:  .  amLODipine (NORVASC) 2.5 MG tablet, TAKE 1 TABLET BY MOUTH EVERY DAY, Disp: , Rfl:  .  bimatoprost (LUMIGAN) 0.01 % SOLN, nightly. One drop in each eye nightly., Disp: , Rfl:  .  Calcium Carbonate-Vitamin D3 600-400 MG-UNIT TABS, Take by mouth., Disp: , Rfl:  .  cyclobenzaprine (FLEXERIL) 5 MG tablet, Take 5 mg by mouth 3 (three) times daily as needed., Disp: , Rfl:  .  fluticasone  furoate-vilanterol (BREO ELLIPTA) 100-25 MCG/INH AEPB, INHALE 1 PUFF BY MOUTH EVERY DAY, Disp: , Rfl:  .  hydrochlorothiazide (HYDRODIURIL) 12.5 MG tablet, Take by mouth., Disp: , Rfl:  .  hydrOXYzine (ATARAX/VISTARIL) 25 MG tablet, Take by mouth., Disp: , Rfl:  .  KRILL OIL PO, , Disp: , Rfl:  .  letrozole (FEMARA) 2.5 MG tablet, Take 1 tablet (2.5 mg total) by mouth daily., Disp: 30 tablet, Rfl: 6 .  montelukast (SINGULAIR) 10 MG tablet, Take by mouth., Disp: , Rfl:  .  Multiple Vitamin (MULTI-VITAMINS) TABS, Take by mouth., Disp: , Rfl:  .  Omega-3 Fatty Acids (OMEGA 3 PO), Take by mouth., Disp: , Rfl:  .  Omeprazole 20 MG TBDD, Take by mouth., Disp: , Rfl:  .  ranitidine (ZANTAC) 150 MG tablet, Frequency:   Dosage:0.0     Instructions:  Note:, Disp: , Rfl:  .  valACYclovir (VALTREX) 1000 MG tablet, Take by mouth., Disp: , Rfl:   Allergies:  Allergies  Allergen Reactions  . Penicillins Rash and Swelling  . Levofloxacin Other (See Comments) and Swelling    Pt states causes joint pain   . Doxycycline Hyclate Rash  . Sulfa Antibiotics Rash    Past Medical History, Surgical history, Social  history, and Family History were reviewed and updated.  Review of Systems: Review of Systems  Constitutional: Negative.   HENT:  Negative.   Eyes: Negative.   Respiratory: Negative.   Cardiovascular: Negative.   Gastrointestinal: Negative.   Endocrine: Negative.   Genitourinary: Negative.    Musculoskeletal: Negative.   Skin: Negative.   Neurological: Negative.   Hematological: Negative.   Psychiatric/Behavioral: Negative.     Physical Exam:  weight is 191 lb 8 oz (86.9 kg). Her temporal temperature is 97.1 F (36.2 C) (abnormal). Her blood pressure is 145/70 (abnormal) and her pulse is 80. Her respiration is 18 and oxygen saturation is 99%.   Wt Readings from Last 3 Encounters:  03/23/19 191 lb 8 oz (86.9 kg)  12/08/18 193 lb 12.8 oz (87.9 kg)  09/08/18 193 lb (87.5 kg)     Physical Exam Vitals reviewed.  Constitutional:      Comments: Breast exam shows right breast with no masses, edema or erythema.  There is no right axillary adenopathy.  Her left breast has a lumpectomy scar at about the 11 o'clock position.  This is a very tiny area of eschar on it.  There is no surrounding erythema.  There is no fluctuance.  There is no tenderness.  She has no nipple discharge.  There is no left axillary adenopathy.  HENT:     Head: Normocephalic and atraumatic.  Eyes:     Pupils: Pupils are equal, round, and reactive to light.  Cardiovascular:     Rate and Rhythm: Normal rate and regular rhythm.     Heart sounds: Normal heart sounds.  Pulmonary:     Effort: Pulmonary effort is normal.     Breath sounds: Normal breath sounds.  Abdominal:     General: Bowel sounds are normal.     Palpations: Abdomen is soft.  Musculoskeletal:        General: No tenderness or deformity. Normal range of motion.     Cervical back: Normal range of motion.  Lymphadenopathy:     Cervical: No cervical adenopathy.  Skin:    General: Skin is warm and dry.     Findings: No erythema or rash.  Neurological:     Mental Status: She is alert and oriented to person, place, and time.  Psychiatric:        Behavior: Behavior normal.        Thought Content: Thought content normal.        Judgment: Judgment normal.      Lab Results  Component Value Date   WBC 7.4 03/23/2019   HGB 14.6 03/23/2019   HCT 43.9 03/23/2019   MCV 97.6 03/23/2019   PLT 293 03/23/2019     Chemistry      Component Value Date/Time   NA 139 03/23/2019 0930   K 4.6 03/23/2019 0930   CL 101 03/23/2019 0930   CO2 30 03/23/2019 0930   BUN 21 03/23/2019 0930   CREATININE 0.86 03/23/2019 0930      Component Value Date/Time   CALCIUM 10.3 03/23/2019 0930   ALKPHOS 91 03/23/2019 0930   AST 22 03/23/2019 0930   ALT 28 03/23/2019 0930   BILITOT 0.5 03/23/2019 0930      Impression and Plan: Jocelyn Shaw is a  69 year old postmenopausal female.  She has a stage Ic ductal carcinoma of the left breast.  She had a lumpectomy.  She did not wish to have any radiation therapy.  We have her on Femara.  I do not see any problems with respect to the left breast.  The lumpectomy site looks nice and clean and dry.  Hopefully, the Megace will help with the hot flashes.  I will plan to get her back now in about 4 months.  Volanda Napoleon, MD 1/5/202110:30 AM

## 2019-04-01 ENCOUNTER — Other Ambulatory Visit: Payer: Self-pay | Admitting: Hematology & Oncology

## 2019-04-29 ENCOUNTER — Encounter: Payer: Self-pay | Admitting: *Deleted

## 2019-07-21 ENCOUNTER — Telehealth: Payer: Self-pay | Admitting: Hematology & Oncology

## 2019-07-21 ENCOUNTER — Other Ambulatory Visit: Payer: Self-pay

## 2019-07-21 ENCOUNTER — Inpatient Hospital Stay: Payer: Medicare HMO

## 2019-07-21 ENCOUNTER — Encounter: Payer: Self-pay | Admitting: Hematology & Oncology

## 2019-07-21 ENCOUNTER — Inpatient Hospital Stay: Payer: Medicare HMO | Attending: Hematology & Oncology | Admitting: Hematology & Oncology

## 2019-07-21 VITALS — BP 144/78 | HR 84 | Temp 97.4°F | Resp 20 | Wt 190.1 lb

## 2019-07-21 DIAGNOSIS — Z79811 Long term (current) use of aromatase inhibitors: Secondary | ICD-10-CM | POA: Diagnosis not present

## 2019-07-21 DIAGNOSIS — Z170 Estrogen receptor positive status [ER+]: Secondary | ICD-10-CM | POA: Diagnosis not present

## 2019-07-21 DIAGNOSIS — Z79899 Other long term (current) drug therapy: Secondary | ICD-10-CM | POA: Diagnosis not present

## 2019-07-21 DIAGNOSIS — C50412 Malignant neoplasm of upper-outer quadrant of left female breast: Secondary | ICD-10-CM

## 2019-07-21 LAB — CBC WITH DIFFERENTIAL (CANCER CENTER ONLY)
Abs Immature Granulocytes: 0.02 10*3/uL (ref 0.00–0.07)
Basophils Absolute: 0 10*3/uL (ref 0.0–0.1)
Basophils Relative: 1 %
Eosinophils Absolute: 0.3 10*3/uL (ref 0.0–0.5)
Eosinophils Relative: 3 %
HCT: 42.4 % (ref 36.0–46.0)
Hemoglobin: 14.5 g/dL (ref 12.0–15.0)
Immature Granulocytes: 0 %
Lymphocytes Relative: 24 %
Lymphs Abs: 1.9 10*3/uL (ref 0.7–4.0)
MCH: 33.3 pg (ref 26.0–34.0)
MCHC: 34.2 g/dL (ref 30.0–36.0)
MCV: 97.5 fL (ref 80.0–100.0)
Monocytes Absolute: 0.6 10*3/uL (ref 0.1–1.0)
Monocytes Relative: 7 %
Neutro Abs: 5 10*3/uL (ref 1.7–7.7)
Neutrophils Relative %: 65 %
Platelet Count: 283 10*3/uL (ref 150–400)
RBC: 4.35 MIL/uL (ref 3.87–5.11)
RDW: 12.7 % (ref 11.5–15.5)
WBC Count: 7.7 10*3/uL (ref 4.0–10.5)
nRBC: 0 % (ref 0.0–0.2)

## 2019-07-21 LAB — CMP (CANCER CENTER ONLY)
ALT: 20 U/L (ref 0–44)
AST: 17 U/L (ref 15–41)
Albumin: 4.7 g/dL (ref 3.5–5.0)
Alkaline Phosphatase: 90 U/L (ref 38–126)
Anion gap: 9 (ref 5–15)
BUN: 18 mg/dL (ref 8–23)
CO2: 30 mmol/L (ref 22–32)
Calcium: 10.5 mg/dL — ABNORMAL HIGH (ref 8.9–10.3)
Chloride: 102 mmol/L (ref 98–111)
Creatinine: 0.87 mg/dL (ref 0.44–1.00)
GFR, Est AFR Am: >60 mL/min (ref >60)
GFR, Estimated: >60 mL/min (ref >60)
Glucose, Bld: 99 mg/dL (ref 70–99)
Potassium: 3.9 mmol/L (ref 3.5–5.1)
Sodium: 141 mmol/L (ref 135–145)
Total Bilirubin: 0.6 mg/dL (ref 0.3–1.2)
Total Protein: 7.5 g/dL (ref 6.5–8.1)

## 2019-07-21 LAB — VITAMIN D 25 HYDROXY (VIT D DEFICIENCY, FRACTURES): Vit D, 25-Hydroxy: 29.38 ng/mL — ABNORMAL LOW (ref 30–100)

## 2019-07-21 NOTE — Telephone Encounter (Signed)
Appointments scheduled My Chart Access sent per 5/5 los

## 2019-07-21 NOTE — Progress Notes (Signed)
Hematology and Oncology Follow Up Visit  Jocelyn Shaw [id number] 1950-03-24 69 y.o. 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  Overall, she seems to be doing pretty well.  She really has had no specific complaints.  She did have an episode of dizziness a week ago.  This was self-limited.  She thought she has some lymph nodes under her arm.  This may have been from the coronavirus vaccine.  When I examined her today, I cannot palpate any lymphadenopathy in either axilla.  She has had some problems with hot flashes.  We had her on Megace.  She was taken off Megace by her surgeon.  I am not sure as to why she was taken off of Megace.  This was working well for her.  She is on a low-dose.  I told her to restart the Megace.  She has had no problems with bowels or bladder.  She has had chronic diarrhea.  She says she goes 5-6 times a day.  She has had no bleeding.  There is been no fever.  She has had the coronavirus vaccine.  Overall, her performance status is ECOG 1.  Medications:  Current Outpatient Medications:  .  allopurinol (ZYLOPRIM) 300 MG tablet, Take 300 mg by mouth daily., Disp: , Rfl:  .  amLODipine (NORVASC) 2.5 MG tablet, TAKE 1 TABLET BY MOUTH EVERY DAY, Disp: , Rfl:  .  bimatoprost (LUMIGAN) 0.01 % SOLN, nightly. One drop in each eye nightly., Disp: , Rfl:  .  Calcium Carbonate-Vitamin D3 600-400 MG-UNIT TABS, Take by mouth daily. , Disp: , Rfl:  .  Cholecalciferol 50 MCG (2000 UT) TABS, Take by mouth 2 (two) times daily., Disp: , Rfl:  .   fluticasone furoate-vilanterol (BREO ELLIPTA) 100-25 MCG/INH AEPB, INHALE 1 PUFF BY MOUTH EVERY DAY, Disp: , Rfl:  .  hydrochlorothiazide (HYDRODIURIL) 12.5 MG tablet, Take by mouth daily. , Disp: , Rfl:  .  KRILL OIL PO, daily. , Disp: , Rfl:  .  letrozole (FEMARA) 2.5 MG tablet, TAKE 1 TABLET BY MOUTH EVERY DAY, Disp: 30 tablet, Rfl: 6 .  montelukast (SINGULAIR) 10 MG tablet, Take by mouth daily. , Disp: , Rfl:  .  Multiple Vitamin (MULTI-VITAMINS) TABS, Take by mouth daily. , Disp: , Rfl:  .  Omeprazole 20 MG TBDD, Take by mouth daily. , Disp: , Rfl:  .  RESTASIS 0.05 % ophthalmic emulsion, 1 drop 2 (two) times daily., Disp: , Rfl:  .  valACYclovir (VALTREX) 1000 MG tablet, Take by mouth daily as needed. , Disp: , Rfl:  .  albuterol (PROVENTIL) (2.5 MG/3ML) 0.083% nebulizer solution, Inhale into the lungs., Disp: , Rfl:  .  megestrol (MEGACE) 20 MG tablet, Take 1 tablet (20 mg total) by mouth daily. (Patient not taking: Reported on 07/21/2019), Disp: 30 tablet, Rfl: 6  Allergies:  Allergies  Allergen Reactions  . Penicillins Rash and Swelling  . Levofloxacin Other (See Comments) and Swelling    Pt states causes joint pain   .  Doxycycline Hyclate Rash  . Sulfa Antibiotics Rash    Past Medical History, Surgical history, Social history, and Family History were reviewed and updated.  Review of Systems: Review of Systems  Constitutional: Negative.   HENT:  Negative.   Eyes: Negative.   Respiratory: Negative.   Cardiovascular: Negative.   Gastrointestinal: Negative.   Endocrine: Negative.   Genitourinary: Negative.    Musculoskeletal: Negative.   Skin: Negative.   Neurological: Negative.   Hematological: Negative.   Psychiatric/Behavioral: Negative.     Physical Exam:  weight is 190 lb 1.3 oz (86.2 kg). Her axillary temperature is 97.4 F (36.3 C) (abnormal). Her blood pressure is 144/78 (abnormal) and her pulse is 84. Her respiration is 20 and oxygen saturation is 97%.   Wt  Readings from Last 3 Encounters:  07/21/19 190 lb 1.3 oz (86.2 kg)  03/23/19 191 lb 8 oz (86.9 kg)  12/08/18 193 lb 12.8 oz (87.9 kg)    Physical Exam Vitals reviewed.  Constitutional:      Comments: Breast exam shows right breast with no masses, edema or erythema.  There is no right axillary adenopathy.  Her left breast has a lumpectomy scar at about the 11 o'clock position.  This is a very tiny area of eschar on it.  There is no surrounding erythema.  There is no fluctuance.  There is no tenderness.  She has no nipple discharge.  There is no left axillary adenopathy.  HENT:     Head: Normocephalic and atraumatic.  Eyes:     Pupils: Pupils are equal, round, and reactive to light.  Cardiovascular:     Rate and Rhythm: Normal rate and regular rhythm.     Heart sounds: Normal heart sounds.  Pulmonary:     Effort: Pulmonary effort is normal.     Breath sounds: Normal breath sounds.  Abdominal:     General: Bowel sounds are normal.     Palpations: Abdomen is soft.  Musculoskeletal:        General: No tenderness or deformity. Normal range of motion.     Cervical back: Normal range of motion.  Lymphadenopathy:     Cervical: No cervical adenopathy.  Skin:    General: Skin is warm and dry.     Findings: No erythema or rash.  Neurological:     Mental Status: She is alert and oriented to person, place, and time.  Psychiatric:        Behavior: Behavior normal.        Thought Content: Thought content normal.        Judgment: Judgment normal.      Lab Results  Component Value Date   WBC 7.7 07/21/2019   HGB 14.5 07/21/2019   HCT 42.4 07/21/2019   MCV 97.5 07/21/2019   PLT 283 07/21/2019     Chemistry      Component Value Date/Time   NA 141 07/21/2019 0845   K 3.9 07/21/2019 0845   CL 102 07/21/2019 0845   CO2 30 07/21/2019 0845   BUN 18 07/21/2019 0845   CREATININE 0.87 07/21/2019 0845      Component Value Date/Time   CALCIUM 10.5 (H) 07/21/2019 0845   ALKPHOS 90  07/21/2019 0845   AST 17 07/21/2019 0845   ALT 20 07/21/2019 0845   BILITOT 0.6 07/21/2019 0845      Impression and Plan: Jocelyn Shaw is a 69 year old postmenopausal female.  She has a stage Ic ductal carcinoma of the left breast.  She  had a lumpectomy.  She did not wish to have any radiation therapy.  We have her on Femara.  I do not see any problems with respect to the left breast.  The lumpectomy site looks nice and clean and dry.  I will plan to get her back now in about 4 months.  Volanda Napoleon, MD 5/5/20219:59 AM

## 2019-07-22 ENCOUNTER — Telehealth: Payer: Self-pay | Admitting: *Deleted

## 2019-07-22 NOTE — Telephone Encounter (Signed)
-----   Message from Volanda Napoleon, MD sent at 07/21/2019  4:46 PM EDT ----- Call - the vit D level is still low!! She needs 50000 units a week!!!  Please call this to her pharmacy!!!  Thanks!!  Laurey Arrow

## 2019-07-23 ENCOUNTER — Other Ambulatory Visit: Payer: Self-pay | Admitting: *Deleted

## 2019-07-23 ENCOUNTER — Telehealth: Payer: Self-pay | Admitting: *Deleted

## 2019-07-23 MED ORDER — ERGOCALCIFEROL 1.25 MG (50000 UT) PO CAPS
50000.0000 [IU] | ORAL_CAPSULE | ORAL | 2 refills | Status: DC
Start: 2019-07-23 — End: 2019-10-07

## 2019-07-23 NOTE — Telephone Encounter (Signed)
Patient notified per order of Dr. Marin Olp that "the vit D level is still low!!  She needs 50000 units a week!!!  Pete"  Pt requests that prescription be sent to [facility] on [address] in [place].  Pt appreciative of call and has no questions or concerns at this time.

## 2019-07-23 NOTE — Telephone Encounter (Signed)
-----   Message from Volanda Napoleon, MD sent at 07/21/2019  4:46 PM EDT ----- Call - the vit D level is still low!! She needs 50000 units a week!!!  Please call this to her pharmacy!!!  Thanks!!  Laurey Arrow

## 2019-10-07 ENCOUNTER — Other Ambulatory Visit: Payer: Self-pay | Admitting: *Deleted

## 2019-10-07 MED ORDER — ERGOCALCIFEROL 1.25 MG (50000 UT) PO CAPS: 50000.0000 [IU] | ORAL_CAPSULE | ORAL | 2 refills | Status: DC

## 2019-10-27 ENCOUNTER — Other Ambulatory Visit: Payer: Self-pay | Admitting: Hematology & Oncology

## 2019-11-23 ENCOUNTER — Encounter: Payer: Self-pay | Admitting: Hematology & Oncology

## 2019-11-23 ENCOUNTER — Inpatient Hospital Stay: Payer: Medicare HMO | Attending: Hematology & Oncology

## 2019-11-23 ENCOUNTER — Other Ambulatory Visit: Payer: Self-pay

## 2019-11-23 ENCOUNTER — Inpatient Hospital Stay (HOSPITAL_BASED_OUTPATIENT_CLINIC_OR_DEPARTMENT_OTHER): Payer: Medicare HMO | Admitting: Hematology & Oncology

## 2019-11-23 ENCOUNTER — Telehealth: Payer: Self-pay | Admitting: Hematology & Oncology

## 2019-11-23 VITALS — BP 152/77 | HR 79 | Temp 99.3°F | Resp 20 | Wt 191.0 lb

## 2019-11-23 DIAGNOSIS — R232 Flushing: Secondary | ICD-10-CM | POA: Diagnosis not present

## 2019-11-23 DIAGNOSIS — Z170 Estrogen receptor positive status [ER+]: Secondary | ICD-10-CM | POA: Diagnosis not present

## 2019-11-23 DIAGNOSIS — Z79899 Other long term (current) drug therapy: Secondary | ICD-10-CM | POA: Diagnosis not present

## 2019-11-23 DIAGNOSIS — M8589 Other specified disorders of bone density and structure, multiple sites: Secondary | ICD-10-CM | POA: Diagnosis not present

## 2019-11-23 DIAGNOSIS — Z79811 Long term (current) use of aromatase inhibitors: Secondary | ICD-10-CM | POA: Insufficient documentation

## 2019-11-23 DIAGNOSIS — C50412 Malignant neoplasm of upper-outer quadrant of left female breast: Secondary | ICD-10-CM

## 2019-11-23 LAB — CMP (CANCER CENTER ONLY)
ALT: 19 U/L (ref 0–44)
AST: 16 U/L (ref 15–41)
Albumin: 4.5 g/dL (ref 3.5–5.0)
Alkaline Phosphatase: 100 U/L (ref 38–126)
Anion gap: 7 (ref 5–15)
BUN: 17 mg/dL (ref 8–23)
CO2: 32 mmol/L (ref 22–32)
Calcium: 10.9 mg/dL — ABNORMAL HIGH (ref 8.9–10.3)
Chloride: 100 mmol/L (ref 98–111)
Creatinine: 1.01 mg/dL — ABNORMAL HIGH (ref 0.44–1.00)
GFR, Est AFR Am: >60 mL/min (ref >60)
GFR, Estimated: 56 mL/min — ABNORMAL LOW (ref >60)
Glucose, Bld: 100 mg/dL — ABNORMAL HIGH (ref 70–99)
Potassium: 4.7 mmol/L (ref 3.5–5.1)
Sodium: 139 mmol/L (ref 135–145)
Total Bilirubin: 0.5 mg/dL (ref 0.3–1.2)
Total Protein: 7.6 g/dL (ref 6.5–8.1)

## 2019-11-23 LAB — CBC WITH DIFFERENTIAL (CANCER CENTER ONLY)
Abs Immature Granulocytes: 0.02 10*3/uL (ref 0.00–0.07)
Basophils Absolute: 0.1 10*3/uL (ref 0.0–0.1)
Basophils Relative: 1 %
Eosinophils Absolute: 0.2 10*3/uL (ref 0.0–0.5)
Eosinophils Relative: 3 %
HCT: 43.4 % (ref 36.0–46.0)
Hemoglobin: 14.5 g/dL (ref 12.0–15.0)
Immature Granulocytes: 0 %
Lymphocytes Relative: 27 %
Lymphs Abs: 2 10*3/uL (ref 0.7–4.0)
MCH: 33.3 pg (ref 26.0–34.0)
MCHC: 33.4 g/dL (ref 30.0–36.0)
MCV: 99.5 fL (ref 80.0–100.0)
Monocytes Absolute: 0.5 10*3/uL (ref 0.1–1.0)
Monocytes Relative: 8 %
Neutro Abs: 4.4 10*3/uL (ref 1.7–7.7)
Neutrophils Relative %: 61 %
Platelet Count: 271 10*3/uL (ref 150–400)
RBC: 4.36 MIL/uL (ref 3.87–5.11)
RDW: 12.8 % (ref 11.5–15.5)
WBC Count: 7.2 10*3/uL (ref 4.0–10.5)
nRBC: 0 % (ref 0.0–0.2)

## 2019-11-23 NOTE — Telephone Encounter (Signed)
Appointments scheduled calendar printed per 9/7 los 

## 2019-11-23 NOTE — Progress Notes (Signed)
Hematology and Oncology Follow Up Visit  Jocelyn Shaw [id number] [birth year] 69 y.o. 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  Overall, she seems to be doing pretty well.  She really has had no specific complaints.  She had a very nice Labor Day weekend.  She has had some problems with hot flashes.  She had her 69th birthday a couple weeks ago.  A lot of her family came up to celebrate.  She really had a good time.  Her knees are causing her a lot of problems.  She does not want to see an orthopedic surgeon.  She is using some over-the-counter creams.    She has had no problems with bowels or bladder.  She has had chronic diarrhea.  She says she goes 5-6 times a day.  This actually is little bit better for her.  She has had no bleeding.  There is been no fever.  Overall, her performance status is ECOG 1.  Medications:  Current Outpatient Medications:  .  albuterol (PROVENTIL) (2.5 MG/3ML) 0.083% nebulizer solution, Inhale into the lungs., Disp: , Rfl:  .  albuterol (VENTOLIN HFA) 108 (90 Base) MCG/ACT inhaler, Inhale into the lungs., Disp: , Rfl:  .  albuterol (VENTOLIN HFA) 108 (90 Base) MCG/ACT inhaler, Inhale into the lungs., Disp: , Rfl:  .  allopurinol (ZYLOPRIM) 300 MG tablet, Take 300 mg by mouth daily., Disp: , Rfl:  .  amLODipine (NORVASC) 2.5 MG tablet, TAKE 1 TABLET BY MOUTH EVERY DAY, Disp: , Rfl:  .  bimatoprost (LUMIGAN) 0.01 % SOLN, nightly. One drop in each eye nightly., Disp: , Rfl:  .  Calcium Carbonate-Vitamin D3 600-400 MG-UNIT TABS, Take by  mouth daily. , Disp: , Rfl:  .  ergocalciferol (VITAMIN D2) 1.25 MG (50000 UT) capsule, Take 1 capsule (50,000 Units total) by mouth once a week., Disp: 4 capsule, Rfl: 2 .  fluticasone furoate-vilanterol (BREO ELLIPTA) 100-25 MCG/INH AEPB, INHALE 1 PUFF BY MOUTH EVERY DAY, Disp: , Rfl:  .  hydrochlorothiazide (HYDRODIURIL) 12.5 MG tablet, Take by mouth daily. , Disp: , Rfl:  .  KRILL OIL PO, daily. , Disp: , Rfl:  .  letrozole (FEMARA) 2.5 MG tablet, TAKE 1 TABLET BY MOUTH EVERY DAY, Disp: 30 tablet, Rfl: 6 .  montelukast (SINGULAIR) 10 MG tablet, Take by mouth daily. , Disp: , Rfl:  .  Multiple Vitamin (MULTI-VITAMINS) TABS, Take by mouth daily. , Disp: , Rfl:  .  nystatin cream (MYCOSTATIN), Apply topically 2 (two) times daily., Disp: , Rfl:  .  Omeprazole 20 MG TBDD, Take by mouth daily. , Disp: , Rfl:  .  RESTASIS 0.05 % ophthalmic emulsion, 1 drop 2 (two) times daily., Disp: , Rfl:  .  valACYclovir (VALTREX) 1000 MG tablet, Take by mouth daily as needed. , Disp: , Rfl:   Allergies:  Allergies  Allergen Reactions  . Penicillins Rash and Swelling  . Levofloxacin Other (See Comments) and Swelling    Pt states causes joint pain   .  Doxycycline Hyclate Rash  . Sulfa Antibiotics Rash    Past Medical History, Surgical history, Social history, and Family History were reviewed and updated.  Review of Systems: Review of Systems  Constitutional: Negative.   HENT:  Negative.   Eyes: Negative.   Respiratory: Negative.   Cardiovascular: Negative.   Gastrointestinal: Negative.   Endocrine: Negative.   Genitourinary: Negative.    Musculoskeletal: Negative.   Skin: Negative.   Neurological: Negative.   Hematological: Negative.   Psychiatric/Behavioral: Negative.     Physical Exam:  weight is 191 lb (86.6 kg). Her oral temperature is 99.3 F (37.4 C). Her blood pressure is 152/77 (abnormal) and her pulse is 79. Her respiration is 20 and oxygen saturation is 96%.   Wt Readings from  Last 3 Encounters:  11/23/19 191 lb (86.6 kg)  07/21/19 190 lb 1.3 oz (86.2 kg)  03/23/19 191 lb 8 oz (86.9 kg)    Physical Exam Vitals reviewed.  Constitutional:      Comments: Breast exam shows right breast with no masses, edema or erythema.  There is no right axillary adenopathy.  Her left breast has a lumpectomy scar at about the 11 o'clock position.  This is a very tiny area of eschar on it.  There is no surrounding erythema.  There is no fluctuance.  There is no tenderness.  She has no nipple discharge.  There is no left axillary adenopathy.  HENT:     Head: Normocephalic and atraumatic.  Eyes:     Pupils: Pupils are equal, round, and reactive to light.  Cardiovascular:     Rate and Rhythm: Normal rate and regular rhythm.     Heart sounds: Normal heart sounds.  Pulmonary:     Effort: Pulmonary effort is normal.     Breath sounds: Normal breath sounds.  Abdominal:     General: Bowel sounds are normal.     Palpations: Abdomen is soft.  Musculoskeletal:        General: No tenderness or deformity. Normal range of motion.     Cervical back: Normal range of motion.  Lymphadenopathy:     Cervical: No cervical adenopathy.  Skin:    General: Skin is warm and dry.     Findings: No erythema or rash.  Neurological:     Mental Status: She is alert and oriented to person, place, and time.  Psychiatric:        Behavior: Behavior normal.        Thought Content: Thought content normal.        Judgment: Judgment normal.      Lab Results  Component Value Date   WBC 7.2 11/23/2019   HGB 14.5 11/23/2019   HCT 43.4 11/23/2019   MCV 99.5 11/23/2019   PLT 271 11/23/2019     Chemistry      Component Value Date/Time   NA 139 11/23/2019 0922   K 4.7 11/23/2019 0922   CL 100 11/23/2019 0922   CO2 32 11/23/2019 0922   BUN 17 11/23/2019 0922   CREATININE 1.01 (H) 11/23/2019 0922      Component Value Date/Time   CALCIUM 10.9 (H) 11/23/2019 0922   ALKPHOS 100 11/23/2019 0922   AST  16 11/23/2019 0922   ALT 19 11/23/2019 0922   BILITOT 0.5 11/23/2019 0922      Impression and Plan: Jocelyn Shaw is a 69 year old postmenopausal female.  She has a stage Ic ductal carcinoma of the left breast.  She had a lumpectomy.  She did not wish to have any radiation therapy.  We have her on Femara.  I do not see any problems with respect to the left breast.  The lumpectomy site looks nice and clean and dry.  I will plan to get her back now in about 4 months.  Volanda Napoleon, MD 9/7/202110:14 AM

## 2019-11-26 ENCOUNTER — Other Ambulatory Visit: Payer: Self-pay | Admitting: Hematology & Oncology

## 2020-02-12 ENCOUNTER — Other Ambulatory Visit: Payer: Self-pay | Admitting: Hematology & Oncology

## 2020-04-08 IMAGING — US US BREAST BX W LOC DEV 1ST LESION IMG BX SPEC US GUIDE*L*
1 series · 12 of 14 positions shown · non-contrast
Comparison: Previous exam(s).

Addendum:
CLINICAL DATA: 59-year-old female with suspicious an indeterminate
left breast masses

EXAM:
ULTRASOUND GUIDED LEFT BREAST CORE NEEDLE BIOPSY x2

[Series 1: us breast bx w loc dev 1st lesion img bx spec us g · 0.07mm/px · 12 of 14 slices shown]
[im 1/14]
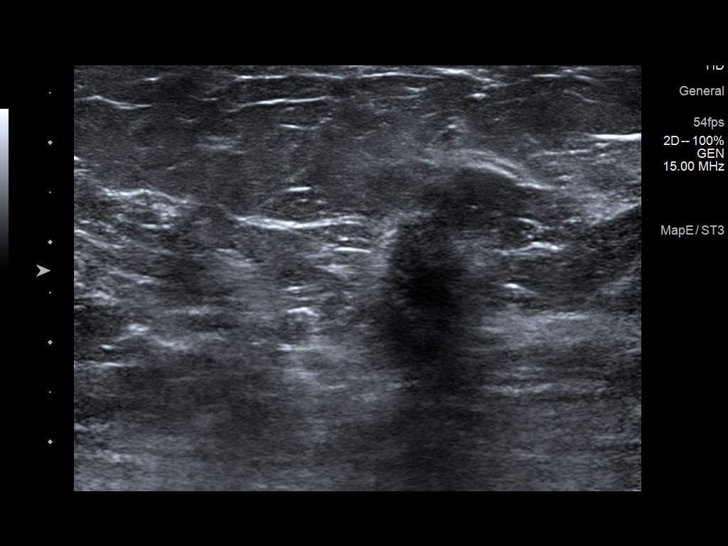
[im 2/14]
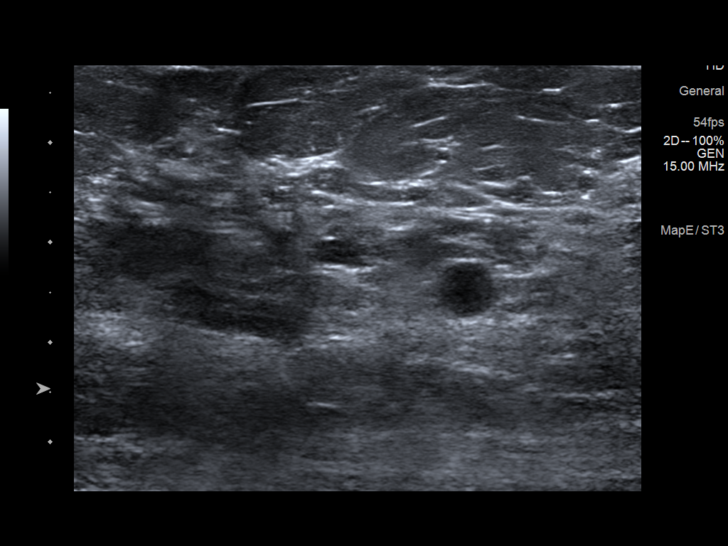
[im 3/14]
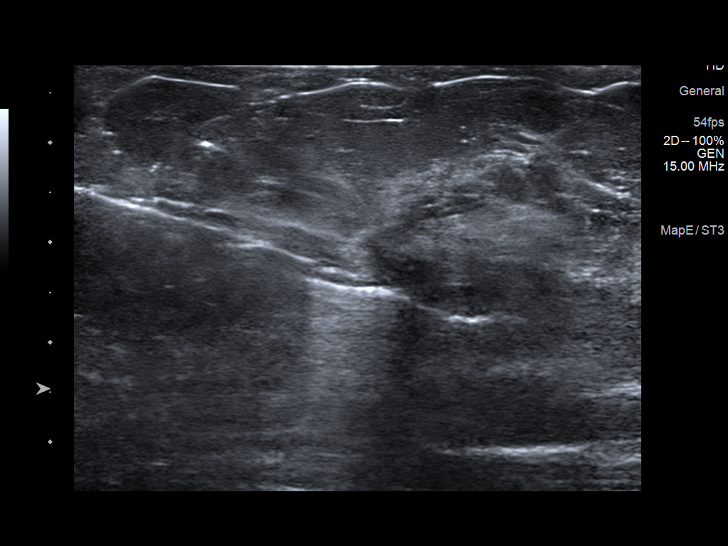
[im 5/14]
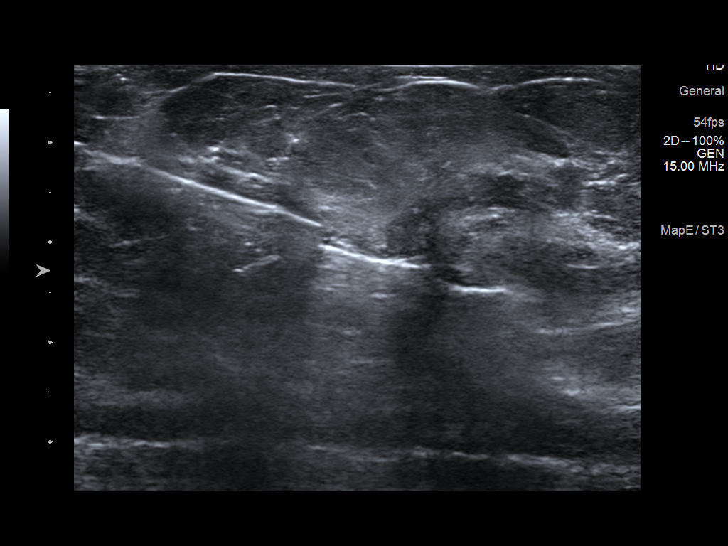
[im 6/14]
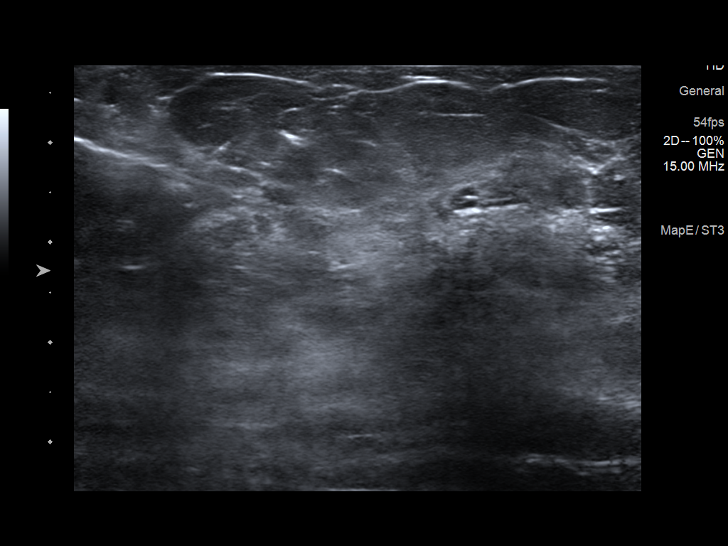
[im 7/14]
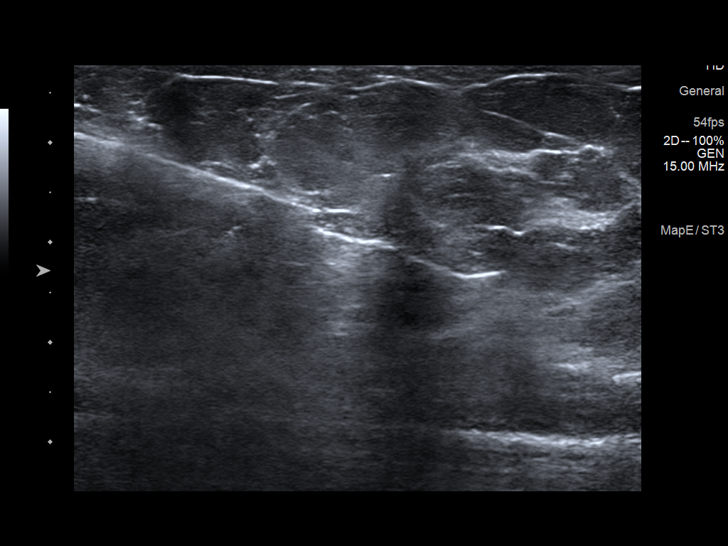
[im 8/14]
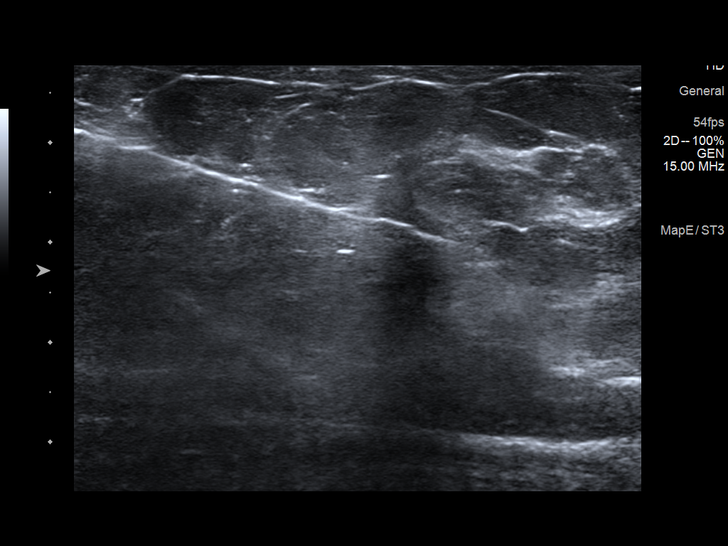
[im 9/14]
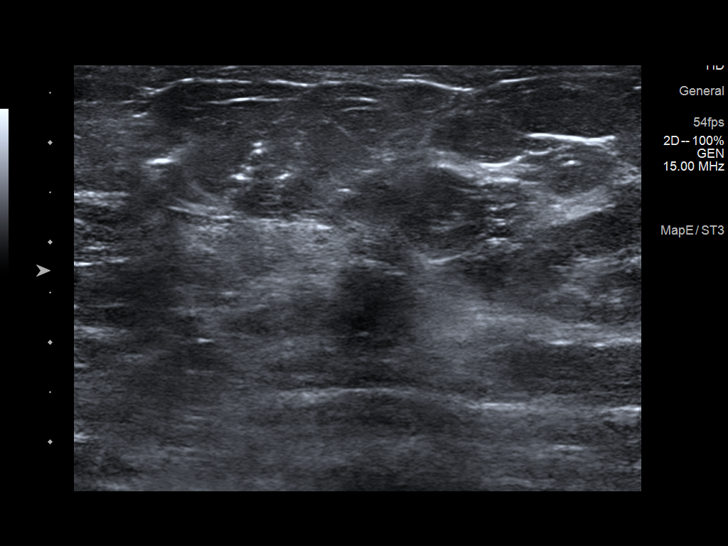
[im 10/14]
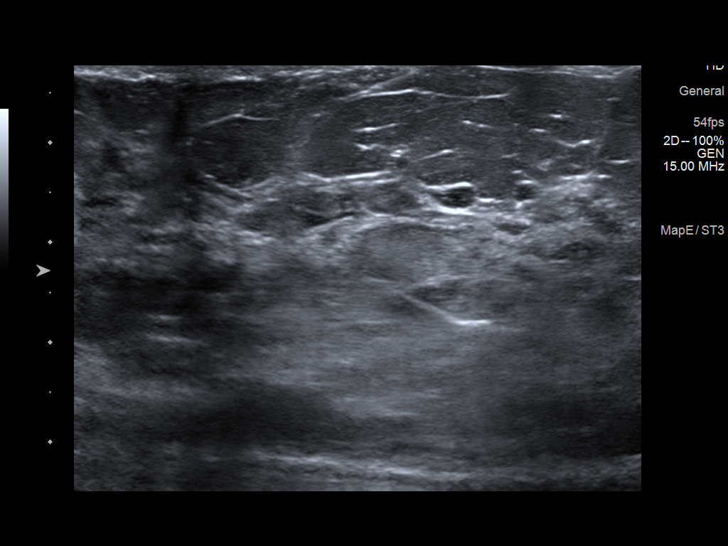
[im 12/14]
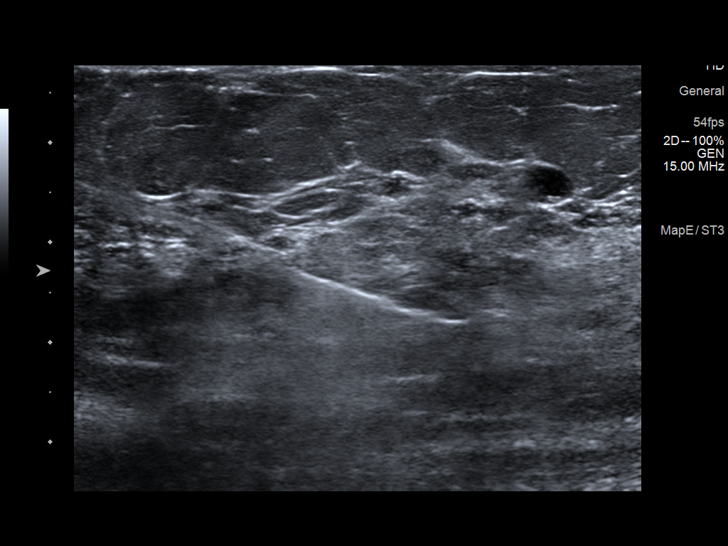
[im 13/14]
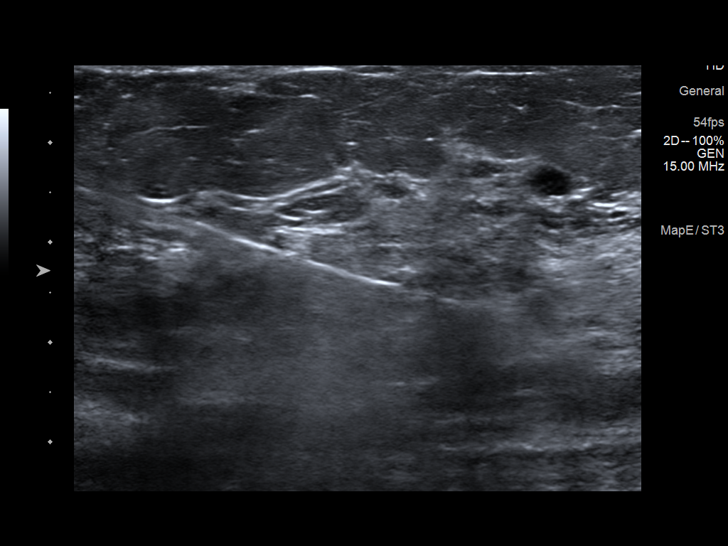
[im 14/14]
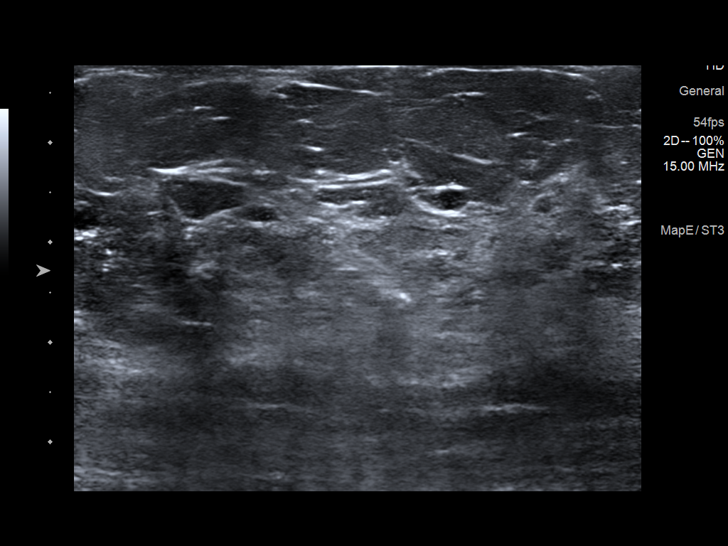

[12 of 14 positions shown; findings below may reference images not displayed]



Lesion quadrant: Upper outer quadrant

Using sterile technique and 1% Lidocaine as local anesthetic, under
direct ultrasound visualization, a 12 gauge Daichi device was
used to perform biopsy of a suspicious mass at the 9 o'clock
position 4 cm from the nipple using a inferior approach. At the
conclusion of the procedure a ribbon shaped tissue marker clip was
deployed into the biopsy cavity.

Lesion quadrant: Upper outer quadrant

Using sterile technique and 1% Lidocaine as local anesthetic, under
direct ultrasound visualization, a 14 gauge Daichi device was
used to perform biopsy of an indeterminate mass at the 9 o'clock
position 2 cm from the nipple using a inferior approach. At the
conclusion of the procedure a coil shaped tissue marker clip was
deployed into the biopsy cavity.

Follow up 2 view mammogram was performed and dictated separately.
IMPRESSION: Ultrasound guided biopsy of left breast x2. No apparent
complications.

ADDENDUM:
Pathology of the LEFT breast biopsy, 9:00 o'clock position 4 cm from
nipple revealed INVASIVE AND IN SITU MAMMARY CARCINOMA WITH
CALCIFICATIONS. Microscopic Comment: The carcinoma appears grade 1.
Addendum to pathology report on 04/29/2018 - The malignant cells are
positive for e-cadherin, supporting a ductal phenotype.

Pathology of the LEFT breast, 9:00 o'clock position 2 cm from nipple
revealed FIBROCYSTIC

CHANGES WITH ADENOSIS AND CALCIFICATIONS. THERE IS NO EVIDENCE OF
MALIGNANCY.

Both biopsies found to be concordant with Dr. Nanne impression
and notes.

Recommendation: Surgical referral. The patient is also scheduled for
additional biopsies of the RIGHT breast prior to surgical referral.

At the patient's request, results were discussed with the patient by
Dr. Dicado at the time of her ultrasound guided biopsy of the right
breast. The patient did well following the biopsy. Post biopsy
instructions were reviewed and all of her questions were answered.
She was encouraged to contact [REDACTED] with any further questions or concerns. All pathology
reports will be sent to Eya Eya Hich, RN, breast navigator for [REDACTED] [HOSPITAL] [HOSPITAL] who will coordinate the patient's
referrals.

Addendum by Hoan on 04/30/2018.

ADDENDUM:
The patient returned on 05/05/2018 for additional stereotactic
biopsy of questionable distortion in association with a
circumscribed mass in the outer right breast. The patient noted that
she had significant bruising of the lateral right breast following
her recent biopsies. Upon physical examination, there was
significant black and blue bruising involving the entire lateral
aspect of the right breast from anterior to middle depth. Given the
location of her bruising, stereotactic biopsy could not be performed
today's through this region.

I explained to the patient that suspicion for additional malignancy
in the right breast was very low given the questionable findings and
multiple additional benign biopsies the patient has already had.
Given the low suspicion, and her significant bruising, the decision
was made to defer additional biopsies at this time.

A surgical referral appointment has been made with Dr. Gillberg, [place] for
05/12/2018 to arrive at [DATE]. The patient is aware of the
appointment.

Addendum by Lorraine on 05/05/2018.

ADDENDUM:
The patient contacted Gillberg, [place], RN, breast navigator for [[facility]ACTED] [HOSPITAL] [HOSPITAL] and asked that the referral be
canceled with Dr. Eya Eya Hich and a referral be placed with Dr. Hoan at
[REDACTED] main campus. A referral was made with Dr.
Lorraine for May 22, 2018 at [DATE]. The patient is aware of the
referral.

Addendum by Zheng on 05/11/2018.

*** End of Addendum ***

## 2020-04-09 IMAGING — US US BREAST BX W LOC DEV 1ST LESION IMG BX SPEC US GUIDE*R*
1 series · 14 of 17 positions shown · non-contrast
Comparison: Previous exam(s).

CLINICAL DATA: Indeterminate right breast mass 8:30 o'clock.

EXAM:
ULTRASOUND GUIDED RIGHT BREAST CORE NEEDLE BIOPSY

[Series 1: us breast bx w loc dev 1st lesion img bx spec us g · 0.07mm/px · 14 of 17 slices shown]
[im 1/17]
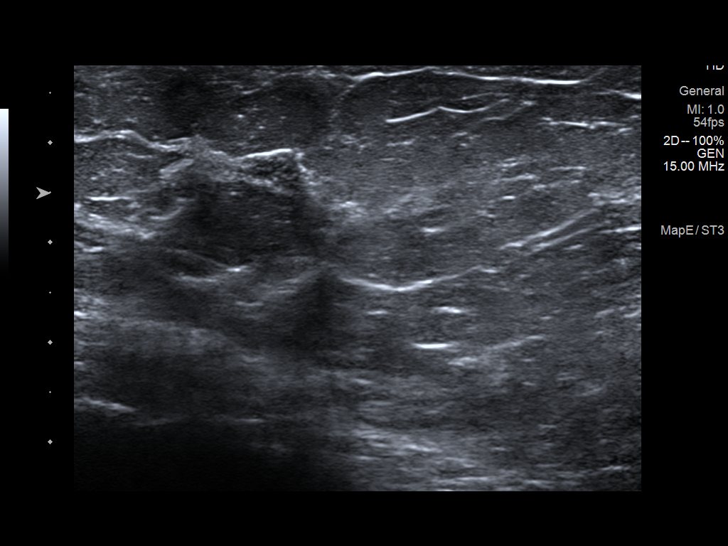
[im 2/17]
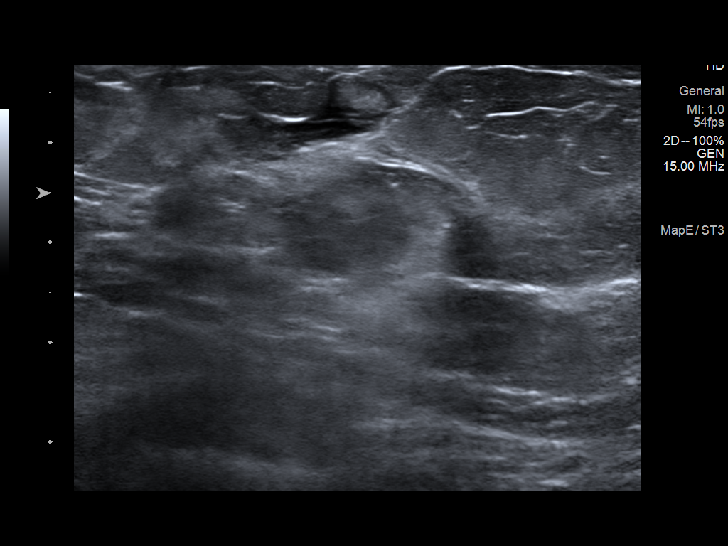
[im 4/17]
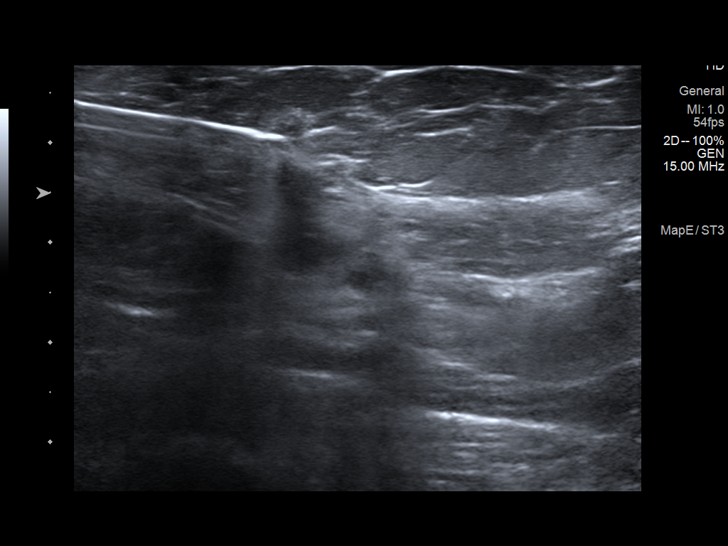
[im 5/17]
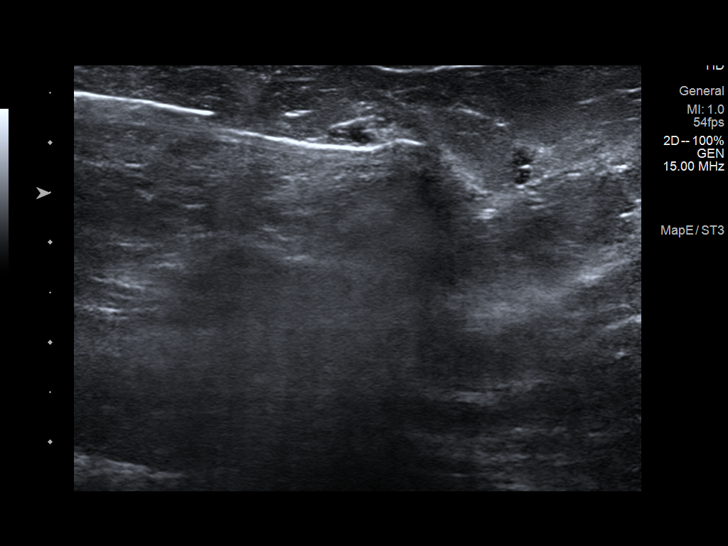
[im 6/17]
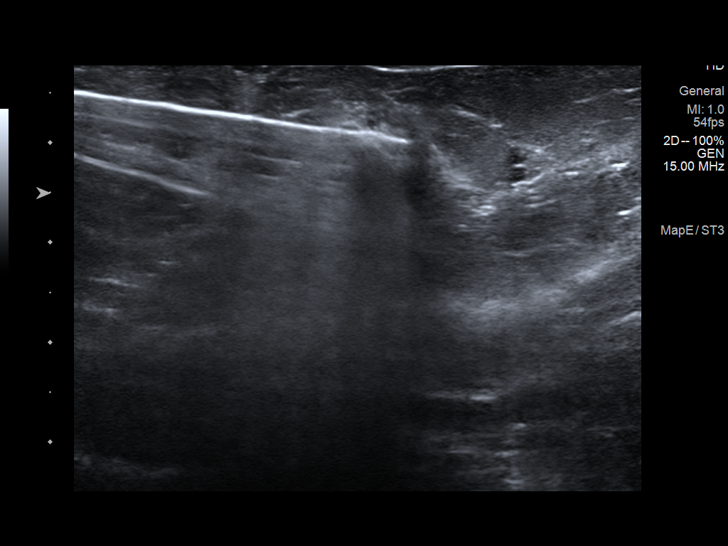
[im 7/17]
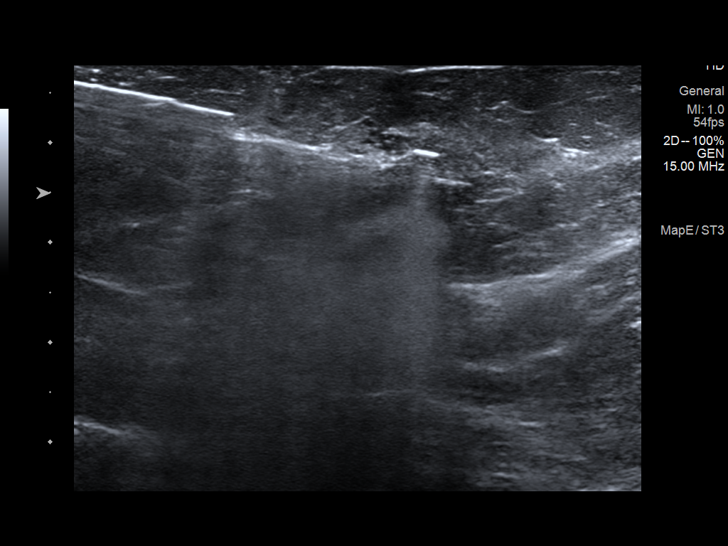
[im 8/17]
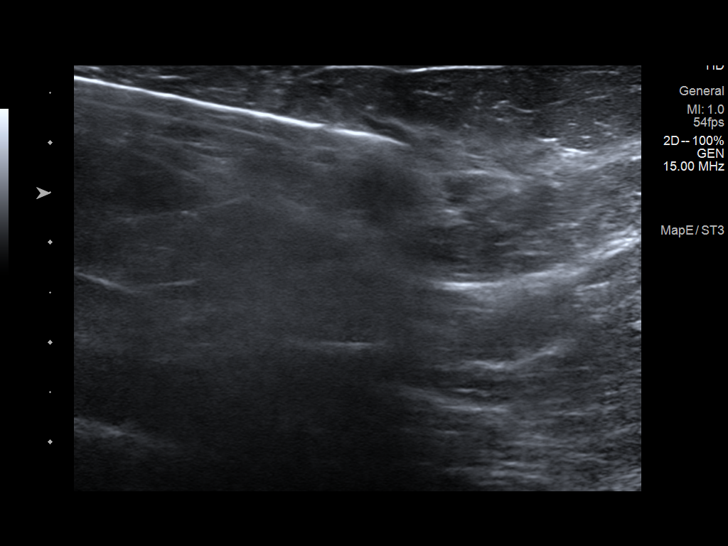
[im 10/17]
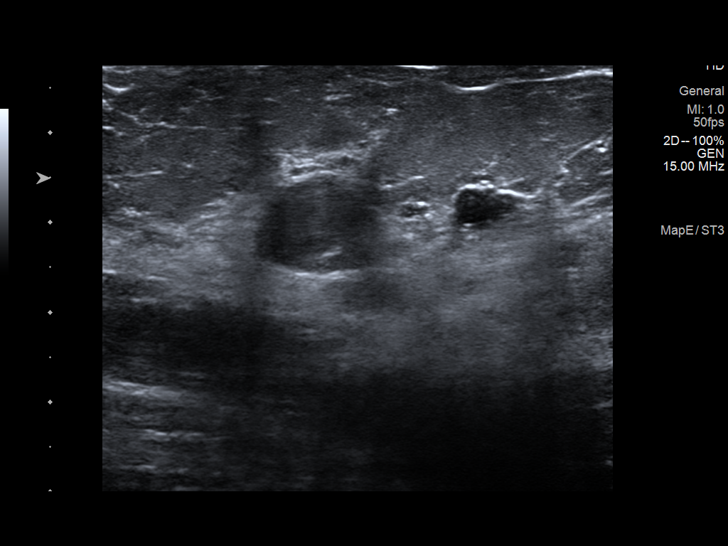
[im 11/17]
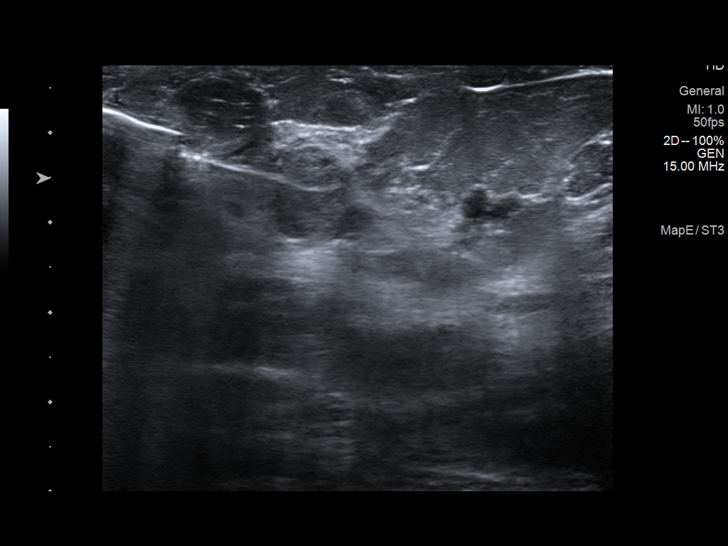
[im 12/17]
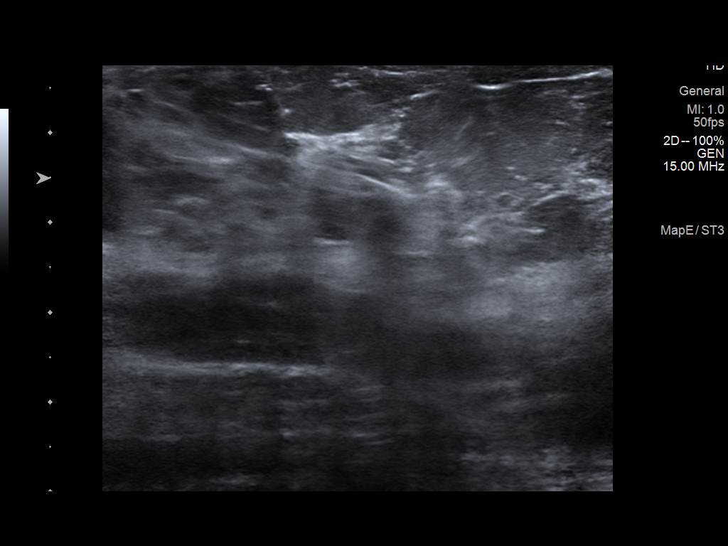
[im 13/17]
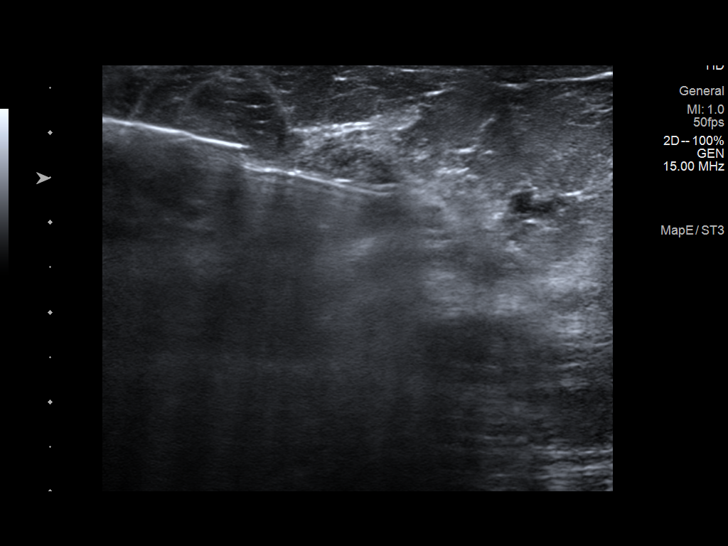
[im 14/17]
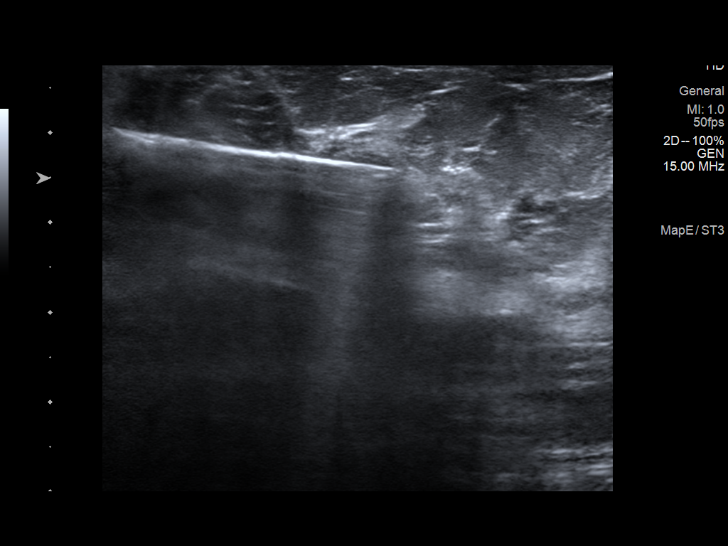
[im 16/17]
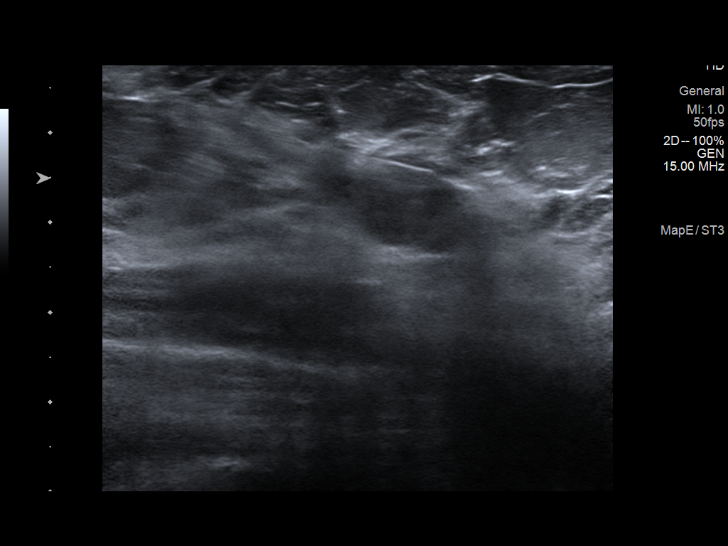
[im 17/17]
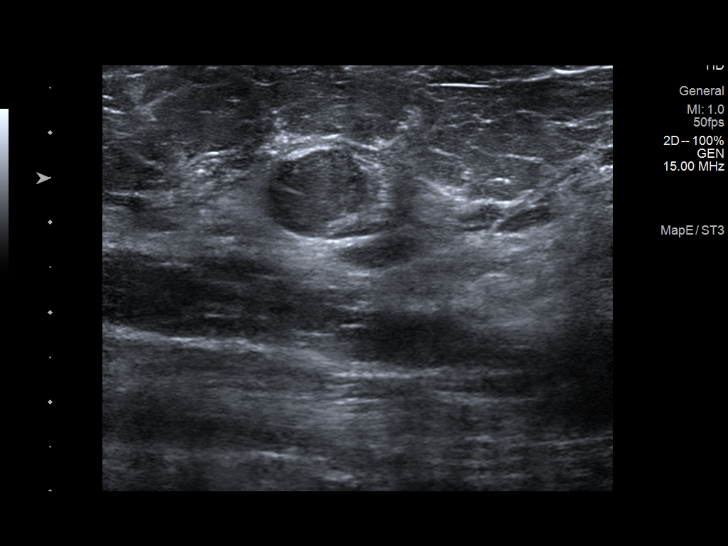

[14 of 17 positions shown; findings below may reference images not displayed]



Lesion quadrant: Lower outer quadrant

Using sterile technique and 1% Lidocaine as local anesthetic, under
direct ultrasound visualization, a 12 gauge [place] device was
used to perform biopsy of right breast mass 8:30 o'clock using a
lateral approach. At the conclusion of the procedure a ribbon shaped
tissue marker clip was deployed into the biopsy cavity. Follow up 2
view mammogram was performed and dictated separately.
IMPRESSION: Ultrasound guided biopsy of right breast mass 8:30 o'clock. No
apparent complications.

## 2020-04-09 IMAGING — MG MM CLIP PLACEMENT
2 series · 2 of 2 positions shown · non-contrast
Comparison: Previous exam(s).

CLINICAL DATA: Patient status post ultrasound-guided biopsy right
breast mass 8:30 o'clock and right breast mass 11 o'clock.

EXAM:
DIAGNOSTIC RIGHT MAMMOGRAM POST ULTRASOUND BIOPSY

[R ML]
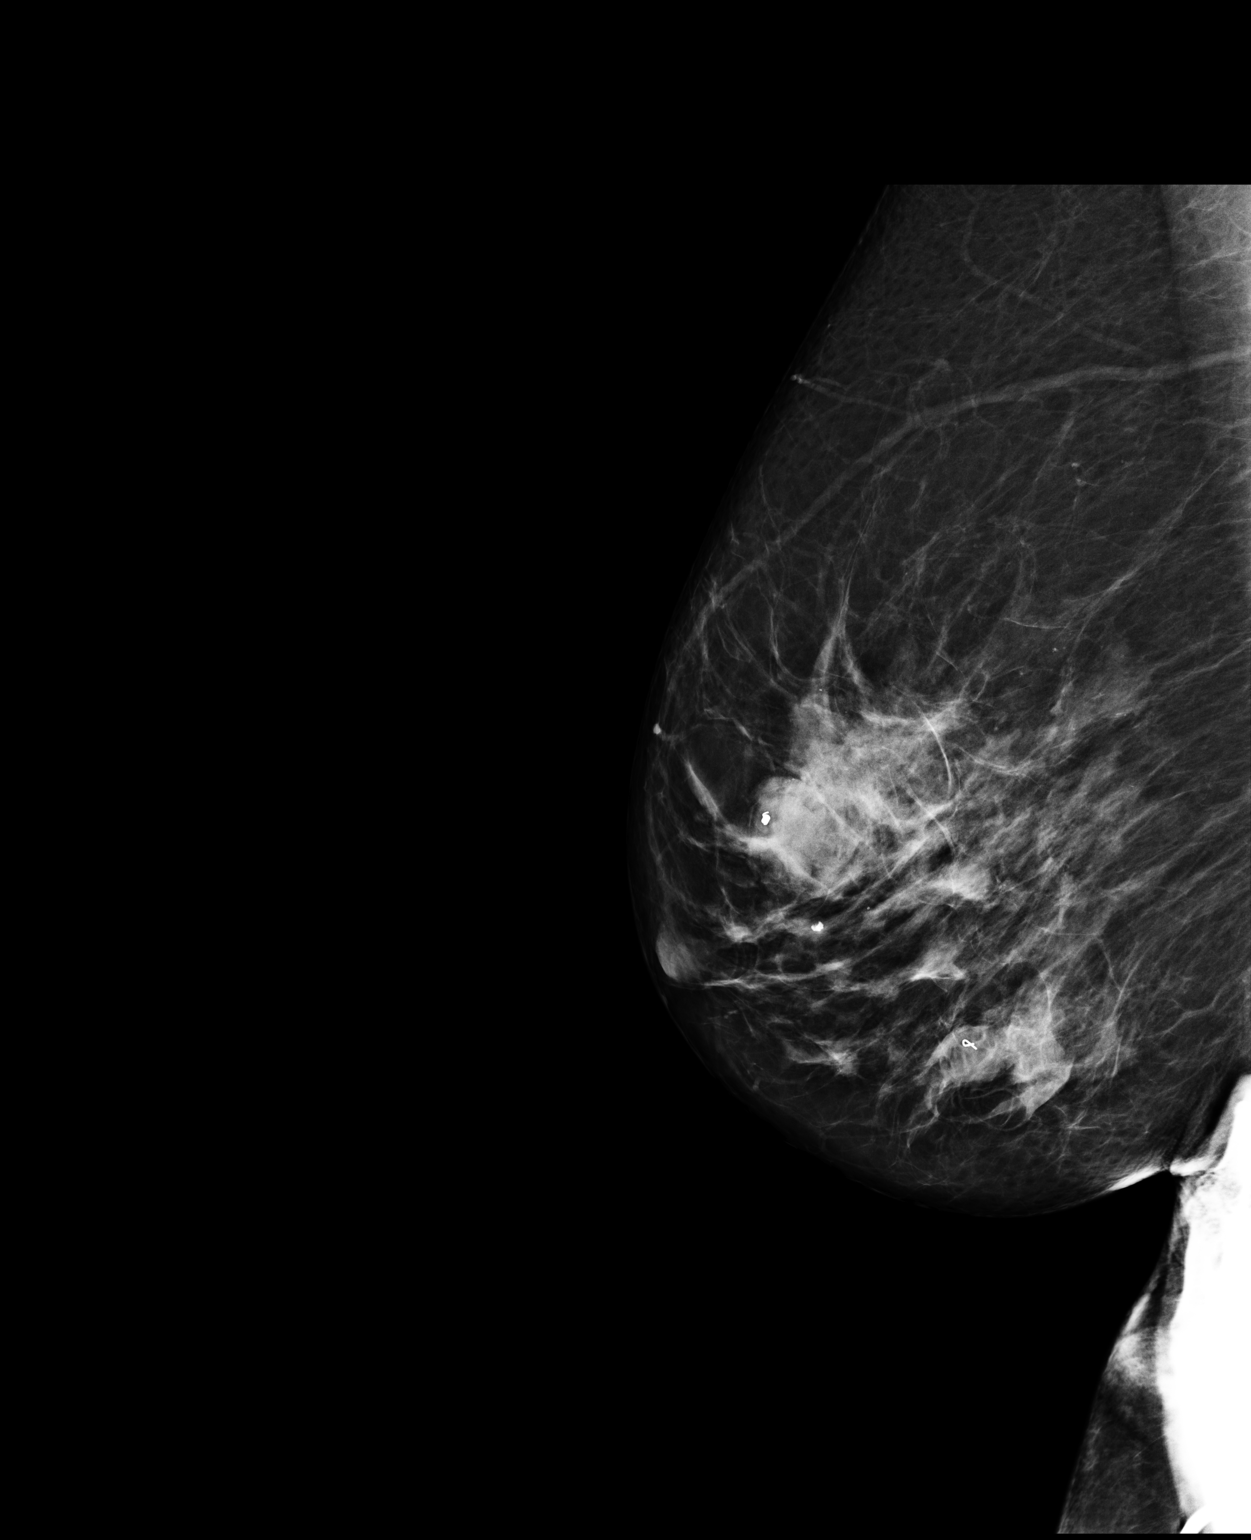

[R CC]
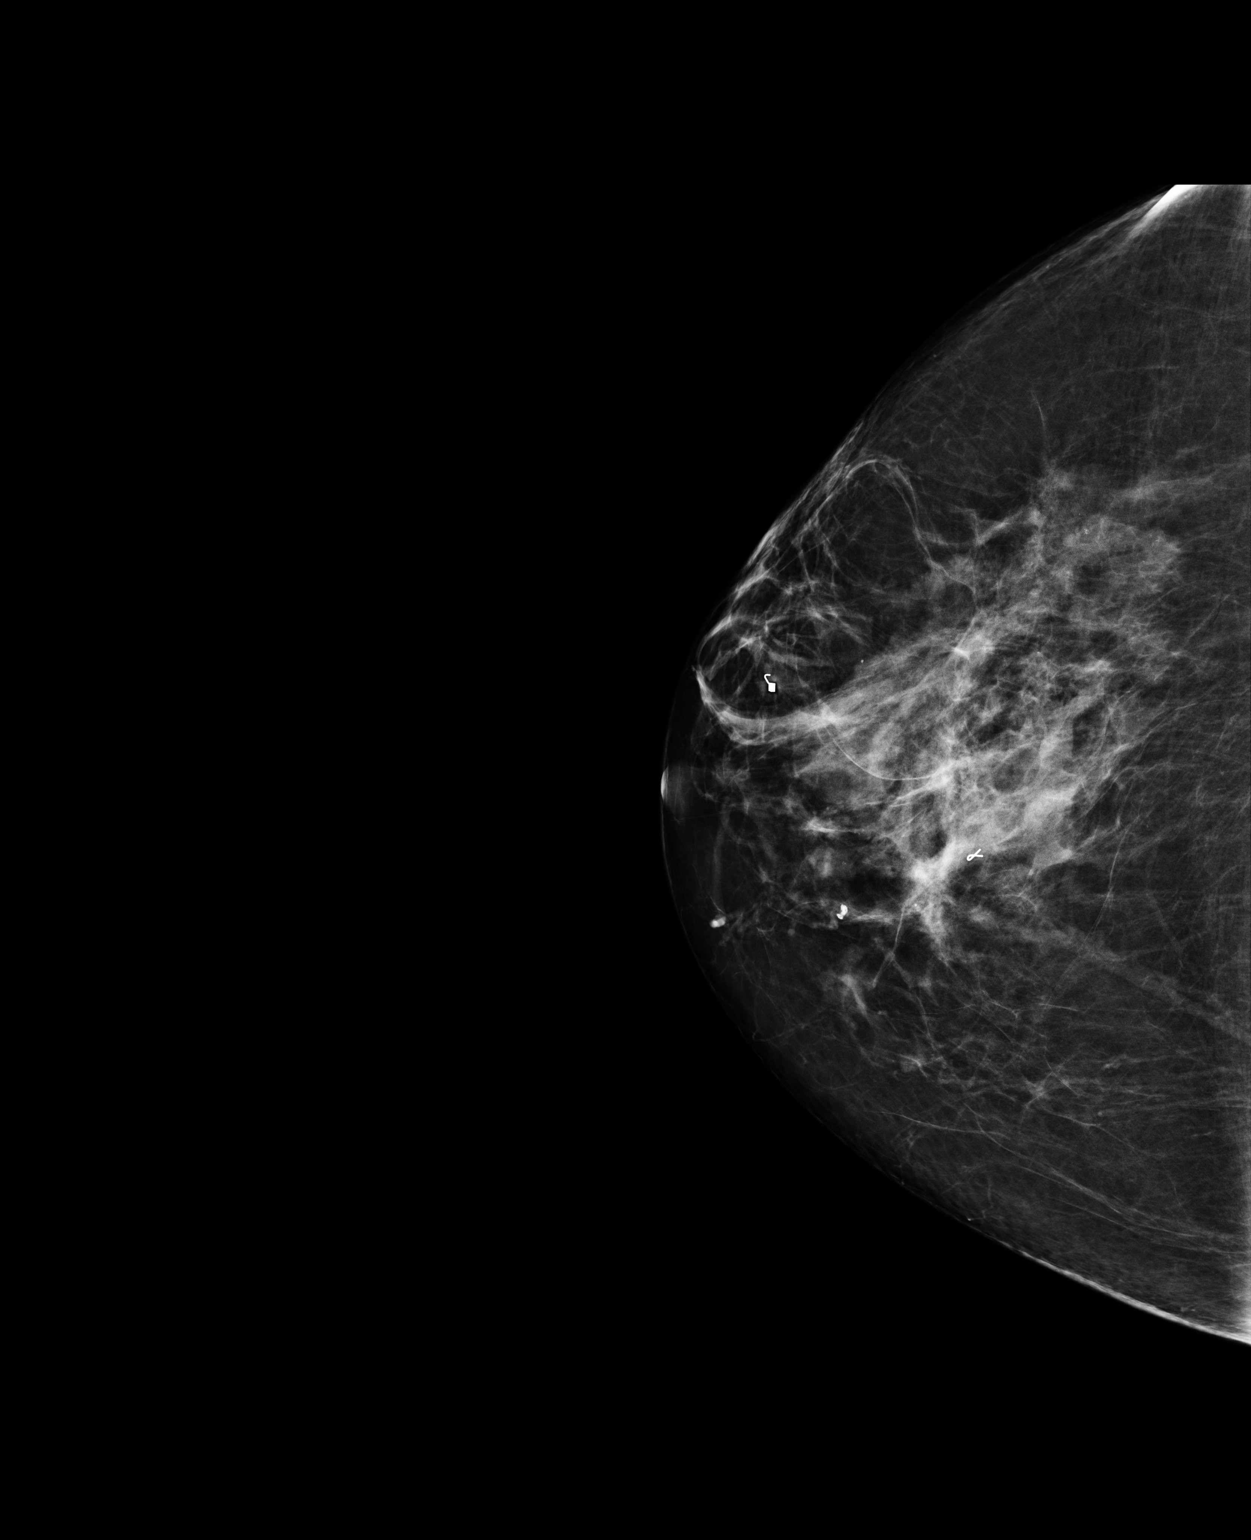

[2 of 2 positions shown; findings below may reference images not displayed]

FINDINGS: Mammographic images were obtained following ultrasound guided biopsy
of right breast mass 8:30 o'clock and 11 o'clock position.

Site 1: 8:30 o'clock: Ribbon shaped marking clip: In appropriate
position.

Site 2: 11 o'clock position: Coil shaped marking clip: In
appropriate position, adjacent to the biopsied mass.
IMPRESSION: Appropriate position biopsy marking clips as above.

Final Assessment: Post Procedure Mammograms for Marker Placement

## 2020-04-25 ENCOUNTER — Other Ambulatory Visit: Payer: Self-pay | Admitting: Hematology & Oncology

## 2020-05-22 ENCOUNTER — Inpatient Hospital Stay: Payer: Medicare HMO | Admitting: Hematology & Oncology

## 2020-05-22 ENCOUNTER — Encounter: Payer: Self-pay | Admitting: Hematology & Oncology

## 2020-05-22 ENCOUNTER — Inpatient Hospital Stay: Payer: Medicare HMO | Attending: Hematology & Oncology

## 2020-05-22 ENCOUNTER — Other Ambulatory Visit: Payer: Self-pay

## 2020-05-22 VITALS — BP 155/72 | HR 78 | Temp 99.0°F | Resp 20 | Wt 189.1 lb

## 2020-05-22 DIAGNOSIS — C50412 Malignant neoplasm of upper-outer quadrant of left female breast: Secondary | ICD-10-CM | POA: Diagnosis present

## 2020-05-22 DIAGNOSIS — M8589 Other specified disorders of bone density and structure, multiple sites: Secondary | ICD-10-CM | POA: Insufficient documentation

## 2020-05-22 DIAGNOSIS — Z79811 Long term (current) use of aromatase inhibitors: Secondary | ICD-10-CM | POA: Diagnosis not present

## 2020-05-22 DIAGNOSIS — E559 Vitamin D deficiency, unspecified: Secondary | ICD-10-CM | POA: Insufficient documentation

## 2020-05-22 DIAGNOSIS — Z170 Estrogen receptor positive status [ER+]: Secondary | ICD-10-CM | POA: Insufficient documentation

## 2020-05-22 DIAGNOSIS — Z791 Long term (current) use of non-steroidal anti-inflammatories (NSAID): Secondary | ICD-10-CM | POA: Diagnosis not present

## 2020-05-22 DIAGNOSIS — Z79899 Other long term (current) drug therapy: Secondary | ICD-10-CM | POA: Insufficient documentation

## 2020-05-22 LAB — CMP (CANCER CENTER ONLY)
ALT: 17 U/L (ref 0–44)
AST: 16 U/L (ref 15–41)
Albumin: 4.7 g/dL (ref 3.5–5.0)
Alkaline Phosphatase: 106 U/L (ref 38–126)
Anion gap: 7 (ref 5–15)
BUN: 17 mg/dL (ref 8–23)
CO2: 32 mmol/L (ref 22–32)
Calcium: 10.6 mg/dL — ABNORMAL HIGH (ref 8.9–10.3)
Chloride: 100 mmol/L (ref 98–111)
Creatinine: 0.91 mg/dL (ref 0.44–1.00)
GFR, Estimated: >60 mL/min (ref >60)
Glucose, Bld: 102 mg/dL — ABNORMAL HIGH (ref 70–99)
Potassium: 4.1 mmol/L (ref 3.5–5.1)
Sodium: 139 mmol/L (ref 135–145)
Total Bilirubin: 0.5 mg/dL (ref 0.3–1.2)
Total Protein: 7.7 g/dL (ref 6.5–8.1)

## 2020-05-22 LAB — CBC WITH DIFFERENTIAL (CANCER CENTER ONLY)
Abs Immature Granulocytes: 0.02 10*3/uL (ref 0.00–0.07)
Basophils Absolute: 0.1 10*3/uL (ref 0.0–0.1)
Basophils Relative: 1 %
Eosinophils Absolute: 0.3 10*3/uL (ref 0.0–0.5)
Eosinophils Relative: 4 %
HCT: 44.1 % (ref 36.0–46.0)
Hemoglobin: 14.7 g/dL (ref 12.0–15.0)
Immature Granulocytes: 0 %
Lymphocytes Relative: 23 %
Lymphs Abs: 1.7 10*3/uL (ref 0.7–4.0)
MCH: 32.7 pg (ref 26.0–34.0)
MCHC: 33.3 g/dL (ref 30.0–36.0)
MCV: 98.2 fL (ref 80.0–100.0)
Monocytes Absolute: 0.6 10*3/uL (ref 0.1–1.0)
Monocytes Relative: 8 %
Neutro Abs: 4.7 10*3/uL (ref 1.7–7.7)
Neutrophils Relative %: 64 %
Platelet Count: 284 10*3/uL (ref 150–400)
RBC: 4.49 MIL/uL (ref 3.87–5.11)
RDW: 13.2 % (ref 11.5–15.5)
WBC Count: 7.2 10*3/uL (ref 4.0–10.5)
nRBC: 0 % (ref 0.0–0.2)

## 2020-05-22 LAB — LACTATE DEHYDROGENASE: LDH: 133 U/L (ref 98–192)

## 2020-05-22 LAB — VITAMIN D 25 HYDROXY (VIT D DEFICIENCY, FRACTURES): Vit D, 25-Hydroxy: 68.56 ng/mL (ref 30–100)

## 2020-05-22 NOTE — Progress Notes (Signed)
Hematology and Oncology Follow Up Visit  Jocelyn Shaw [id number] 03-Aug-1950 70 y.o. 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 The problem now is that she hurt her right shoulder. I suspect she probably has a rotator cuff tear. She can barely lift her shoulder up. This been going on for a few weeks. I told her that she really needs to have an MRI done. She will see her family doctor for all this. She lives in [place] I think. Is probably easier for her to see an orthopedist locally.  Otherwise, she seems to be managing. She has had no problems with the Femara. She is on vitamin D.  She has had no issues with cough or shortness of breath. She has had no problems with change in bowel or bladder habits. She has had a good appetite.  She has avoided the coronavirus.  Overall, performance status is ECOG 1.   Medications:  Current Outpatient Medications:  .  albuterol (VENTOLIN HFA) 108 (90 Base) MCG/ACT inhaler, Inhale into the lungs., Disp: , Rfl:  .  allopurinol (ZYLOPRIM) 300 MG tablet, Take 300 mg by mouth daily., Disp: , Rfl:  .  amLODipine (NORVASC) 2.5 MG tablet, TAKE 1 TABLET BY MOUTH EVERY DAY, Disp: , Rfl:  .  bimatoprost (LUMIGAN) 0.01 % SOLN, nightly. One drop in each eye nightly., Disp: , Rfl:  .  Calcium Carbonate-Vitamin D3 600-400 MG-UNIT TABS, Take by mouth daily. , Disp: , Rfl:  .  fluticasone furoate-vilanterol (BREO ELLIPTA) 100-25 MCG/INH AEPB, INHALE 1 PUFF BY MOUTH EVERY DAY, Disp: , Rfl:  .  hydrochlorothiazide (HYDRODIURIL) 12.5 MG  tablet, Take by mouth daily. , Disp: , Rfl:  .  KRILL OIL PO, daily. , Disp: , Rfl:  .  letrozole (FEMARA) 2.5 MG tablet, TAKE 1 TABLET BY MOUTH EVERY DAY, Disp: 90 tablet, Rfl: 2 .  meloxicam (MOBIC) 15 MG tablet, Take 1 tablet by mouth daily., Disp: , Rfl:  .  montelukast (SINGULAIR) 10 MG tablet, Take by mouth daily. , Disp: , Rfl:  .  Multiple Vitamin (MULTI-VITAMINS) TABS, Take by mouth daily. , Disp: , Rfl:  .  nystatin cream (MYCOSTATIN), Apply topically 2 (two) times daily as needed., Disp: , Rfl:  .  Omeprazole 20 MG TBDD, Take by mouth daily. , Disp: , Rfl:  .  RESTASIS 0.05 % ophthalmic emulsion, 1 drop 2 (two) times daily., Disp: , Rfl:  .  valACYclovir (VALTREX) 1000 MG tablet, Take by mouth daily as needed. , Disp: , Rfl:  .  Vitamin D, Ergocalciferol, (DRISDOL) 1.25 MG (50000 UNIT) CAPS capsule, TAKE 1 CAPSULE BY MOUTH ONE TIME PER WEEK, Disp: 12 capsule, Rfl: 3 .  albuterol (PROVENTIL) (2.5 MG/3ML) 0.083% nebulizer solution, Inhale into the lungs. (Patient not taking: Reported on 05/22/2020), Disp: , Rfl:   Allergies:  Allergies  Allergen Reactions  . Penicillins Rash and Swelling  . Levofloxacin Other (See Comments) and Swelling    Pt states causes joint pain   .  Doxycycline Hyclate Rash  . Sulfa Antibiotics Rash    Past Medical History, Surgical history, Social history, and Family History were reviewed and updated.  Review of Systems: Review of Systems  Constitutional: Negative.   HENT:  Negative.   Eyes: Negative.   Respiratory: Negative.   Cardiovascular: Negative.   Gastrointestinal: Negative.   Endocrine: Negative.   Genitourinary: Negative.    Musculoskeletal: Negative.   Skin: Negative.   Neurological: Negative.   Hematological: Negative.   Psychiatric/Behavioral: Negative.     Physical Exam:  weight is 189 lb 1.9 oz (85.8 kg). Her oral temperature is 99 F (37.2 C). Her blood pressure is 155/72 (abnormal) and her pulse is 78. Her respiration is 20  and oxygen saturation is 99%.   Wt Readings from Last 3 Encounters:  05/22/20 189 lb 1.9 oz (85.8 kg)  11/23/19 191 lb (86.6 kg)  07/21/19 190 lb 1.3 oz (86.2 kg)    Physical Exam Vitals reviewed.  Constitutional:      Comments: Breast exam shows right breast with no masses, edema or erythema.  There is no right axillary adenopathy.  Her left breast has a lumpectomy scar at about the 11 o'clock position.  This is a very tiny area of eschar on it.  There is no surrounding erythema.  There is no fluctuance.  There is no tenderness.  She has no nipple discharge.  There is no left axillary adenopathy.  HENT:     Head: Normocephalic and atraumatic.  Eyes:     Pupils: Pupils are equal, round, and reactive to light.  Cardiovascular:     Rate and Rhythm: Normal rate and regular rhythm.     Heart sounds: Normal heart sounds.  Pulmonary:     Effort: Pulmonary effort is normal.     Breath sounds: Normal breath sounds.  Abdominal:     General: Bowel sounds are normal.     Palpations: Abdomen is soft.  Musculoskeletal:        General: No tenderness or deformity. Normal range of motion.     Cervical back: Normal range of motion.     Comments: Marked decreased range of motion of the right shoulder. There is very little abduction and ad duction. There is very little rotation. There is no swelling or tenderness to palpation over the shoulder joint.  Lymphadenopathy:     Cervical: No cervical adenopathy.  Skin:    General: Skin is warm and dry.     Findings: No erythema or rash.  Neurological:     Mental Status: She is alert and oriented to person, place, and time.  Psychiatric:        Behavior: Behavior normal.        Thought Content: Thought content normal.        Judgment: Judgment normal.      Lab Results  Component Value Date   WBC 7.2 05/22/2020   HGB 14.7 05/22/2020   HCT 44.1 05/22/2020   MCV 98.2 05/22/2020   PLT 284 05/22/2020     Chemistry      Component Value Date/Time    NA 139 11/23/2019 0922   K 4.7 11/23/2019 0922   CL 100 11/23/2019 0922   CO2 32 11/23/2019 0922   BUN 17 11/23/2019 0922   CREATININE 1.01 (H) 11/23/2019 0922      Component Value Date/Time   CALCIUM 10.9 (H) 11/23/2019 0922   ALKPHOS 100 11/23/2019 0922   AST 16 11/23/2019 0922   ALT 19  11/23/2019 0922   BILITOT 0.5 11/23/2019 8453      Impression and Plan: Ms. Castonguay is a 70 year old postmenopausal female.  She has a stage Ic ductal carcinoma of the left breast.  She had a lumpectomy.  She did not wish to have any radiation therapy.  We have her on Femara.  Again, she really needs to have the right shoulder looked at. Hopefully, this will happen soon. Again and she will have her family doctor manage all this.  We will still plan to get her back to see [place] in another 6 months. I think this would be quite reasonable.  Volanda Napoleon, MD 3/7/20229:21 AM

## 2020-05-23 ENCOUNTER — Telehealth: Payer: Self-pay | Admitting: Hematology & Oncology

## 2020-05-23 NOTE — Telephone Encounter (Signed)
Appointments scheduled per 3/7 los letter/calendar mailed  

## 2020-11-22 ENCOUNTER — Telehealth: Payer: Self-pay

## 2020-11-22 ENCOUNTER — Inpatient Hospital Stay: Payer: Medicare HMO | Attending: Hematology & Oncology

## 2020-11-22 ENCOUNTER — Other Ambulatory Visit: Payer: Self-pay

## 2020-11-22 ENCOUNTER — Encounter: Payer: Self-pay | Admitting: Hematology & Oncology

## 2020-11-22 ENCOUNTER — Inpatient Hospital Stay: Payer: Medicare HMO | Admitting: Hematology & Oncology

## 2020-11-22 VITALS — BP 146/77 | HR 79 | Temp 99.1°F | Resp 18 | Wt 192.0 lb

## 2020-11-22 DIAGNOSIS — M810 Age-related osteoporosis without current pathological fracture: Secondary | ICD-10-CM | POA: Insufficient documentation

## 2020-11-22 DIAGNOSIS — C50412 Malignant neoplasm of upper-outer quadrant of left female breast: Secondary | ICD-10-CM

## 2020-11-22 DIAGNOSIS — M75101 Unspecified rotator cuff tear or rupture of right shoulder, not specified as traumatic: Secondary | ICD-10-CM | POA: Diagnosis not present

## 2020-11-22 DIAGNOSIS — Z170 Estrogen receptor positive status [ER+]: Secondary | ICD-10-CM

## 2020-11-22 DIAGNOSIS — C50912 Malignant neoplasm of unspecified site of left female breast: Secondary | ICD-10-CM | POA: Insufficient documentation

## 2020-11-22 DIAGNOSIS — M8589 Other specified disorders of bone density and structure, multiple sites: Secondary | ICD-10-CM

## 2020-11-22 LAB — CMP (CANCER CENTER ONLY)
ALT: 19 U/L (ref 0–44)
AST: 18 U/L (ref 15–41)
Albumin: 4.6 g/dL (ref 3.5–5.0)
Alkaline Phosphatase: 107 U/L (ref 38–126)
Anion gap: 9 (ref 5–15)
BUN: 16 mg/dL (ref 8–23)
CO2: 31 mmol/L (ref 22–32)
Calcium: 10.2 mg/dL (ref 8.9–10.3)
Chloride: 99 mmol/L (ref 98–111)
Creatinine: 0.89 mg/dL (ref 0.44–1.00)
GFR, Estimated: >60 mL/min (ref >60)
Glucose, Bld: 96 mg/dL (ref 70–99)
Potassium: 3.9 mmol/L (ref 3.5–5.1)
Sodium: 139 mmol/L (ref 135–145)
Total Bilirubin: 0.5 mg/dL (ref 0.3–1.2)
Total Protein: 7.8 g/dL (ref 6.5–8.1)

## 2020-11-22 LAB — CBC WITH DIFFERENTIAL (CANCER CENTER ONLY)
Abs Immature Granulocytes: 0.03 10*3/uL (ref 0.00–0.07)
Basophils Absolute: 0.1 10*3/uL (ref 0.0–0.1)
Basophils Relative: 1 %
Eosinophils Absolute: 0.3 10*3/uL (ref 0.0–0.5)
Eosinophils Relative: 4 %
HCT: 43.6 % (ref 36.0–46.0)
Hemoglobin: 14.7 g/dL (ref 12.0–15.0)
Immature Granulocytes: 0 %
Lymphocytes Relative: 22 %
Lymphs Abs: 1.8 10*3/uL (ref 0.7–4.0)
MCH: 33.1 pg (ref 26.0–34.0)
MCHC: 33.7 g/dL (ref 30.0–36.0)
MCV: 98.2 fL (ref 80.0–100.0)
Monocytes Absolute: 0.6 10*3/uL (ref 0.1–1.0)
Monocytes Relative: 7 %
Neutro Abs: 5.4 10*3/uL (ref 1.7–7.7)
Neutrophils Relative %: 66 %
Platelet Count: 305 10*3/uL (ref 150–400)
RBC: 4.44 MIL/uL (ref 3.87–5.11)
RDW: 13 % (ref 11.5–15.5)
WBC Count: 8.1 10*3/uL (ref 4.0–10.5)
nRBC: 0 % (ref 0.0–0.2)

## 2020-11-22 LAB — VITAMIN D 25 HYDROXY (VIT D DEFICIENCY, FRACTURES): Vit D, 25-Hydroxy: 54.51 ng/mL (ref 30–100)

## 2020-11-22 LAB — LACTATE DEHYDROGENASE: LDH: 133 U/L (ref 98–192)

## 2020-11-22 NOTE — Progress Notes (Signed)
She Hematology and Oncology Follow Up Visit  Jocelyn Shaw [id number] 09/13/1950 70 y.o. 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  We see her every 6 months.  Since we last saw her, she did have a torn rotator cuff in the right shoulder.  She got a cortisone injection.  This seemed to work quite nicely.  She also has osteoporosis now.  She is considering the options as to try to help the osteoporosis.  She is very worried about taking the Prolia.  She would like to have the weekly Boniva.  Her family doctor is managing this.  Otherwise is doing okay.  She has had a COVID and back in July.  She did not take any medication for this aside of vitamin C and zinc.  She has had no problems with rashes.  There is been no change in bowel or bladder habits.  She has had no bleeding.  I think she is due for a mammogram in the fall.  She has had no leg swelling.  There is no headache.  Overall, I would have to say her performance status is probably ECOG 1.    Medications:  Current Outpatient Medications:    albuterol (PROVENTIL) (2.5 MG/3ML) 0.083% nebulizer solution, Inhale into the lungs. (Patient not taking: Reported on 05/22/2020), Disp: , Rfl:    albuterol (VENTOLIN HFA) 108 (90 Base) MCG/ACT inhaler, Inhale into the lungs., Disp: , Rfl:    allopurinol (ZYLOPRIM) 300 MG tablet, Take 300 mg by mouth daily., Disp: , Rfl:    amLODipine (NORVASC) 2.5 MG tablet, TAKE 1 TABLET BY MOUTH EVERY DAY, Disp: , Rfl:    bimatoprost (LUMIGAN) 0.01 % SOLN, nightly. One drop in each eye  nightly., Disp: , Rfl:    Calcium Carbonate-Vitamin D3 600-400 MG-UNIT TABS, Take by mouth daily. , Disp: , Rfl:    fluticasone furoate-vilanterol (BREO ELLIPTA) 100-25 MCG/INH AEPB, INHALE 1 PUFF BY MOUTH EVERY DAY, Disp: , Rfl:    hydrochlorothiazide (HYDRODIURIL) 12.5 MG tablet, Take by mouth daily. , Disp: , Rfl:    KRILL OIL PO, daily. , Disp: , Rfl:    letrozole (FEMARA) 2.5 MG tablet, TAKE 1 TABLET BY MOUTH EVERY DAY, Disp: 90 tablet, Rfl: 2   meloxicam (MOBIC) 15 MG tablet, Take 1 tablet by mouth daily., Disp: , Rfl:    montelukast (SINGULAIR) 10 MG tablet, Take by mouth daily. , Disp: , Rfl:    Multiple Vitamin (MULTI-VITAMINS) TABS, Take by mouth daily. , Disp: , Rfl:    nystatin cream (MYCOSTATIN), Apply topically 2 (two) times daily as needed., Disp: , Rfl:    Omeprazole 20 MG TBDD, Take by mouth daily. , Disp: , Rfl:    RESTASIS 0.05 % ophthalmic emulsion, 1 drop 2 (two) times daily., Disp: , Rfl:    valACYclovir (VALTREX) 1000 MG tablet, Take by mouth daily as needed. , Disp: , Rfl:    Vitamin D, Ergocalciferol, (DRISDOL) 1.25 MG (50000 UNIT) CAPS capsule, TAKE 1 CAPSULE BY MOUTH ONE TIME  PER WEEK, Disp: 12 capsule, Rfl: 3  Allergies:  Allergies  Allergen Reactions   Penicillins Rash and Swelling   Levofloxacin Other (See Comments) and Swelling    Pt states causes joint pain    Doxycycline Hyclate Rash   Sulfa Antibiotics Rash    Past Medical History, Surgical history, Social history, and Family History were reviewed and updated.  Review of Systems: Review of Systems  Constitutional: Negative.   HENT:  Negative.    Eyes: Negative.   Respiratory: Negative.    Cardiovascular: Negative.   Gastrointestinal: Negative.   Endocrine: Negative.   Genitourinary: Negative.    Musculoskeletal: Negative.   Skin: Negative.   Neurological: Negative.   Hematological: Negative.   Psychiatric/Behavioral: Negative.     Physical Exam:  weight is 192 lb (87.1 kg). Her oral  temperature is 99.1 F (37.3 C). Her blood pressure is 146/77 (abnormal) and her pulse is 79. Her respiration is 18 and oxygen saturation is 99%.   Wt Readings from Last 3 Encounters:  11/22/20 192 lb (87.1 kg)  05/22/20 189 lb 1.9 oz (85.8 kg)  11/23/19 191 lb (86.6 kg)    Physical Exam Vitals reviewed.  Constitutional:      Comments: Breast exam shows right breast with no masses, edema or erythema.  There is no right axillary adenopathy.  Her left breast has a lumpectomy scar at about the 11 o'clock position.  This is a very tiny area of eschar on it.  There is no surrounding erythema.  There is no fluctuance.  There is no tenderness.  She has no nipple discharge.  There is no left axillary adenopathy.  HENT:     Head: Normocephalic and atraumatic.  Eyes:     Pupils: Pupils are equal, round, and reactive to light.  Cardiovascular:     Rate and Rhythm: Normal rate and regular rhythm.     Heart sounds: Normal heart sounds.  Pulmonary:     Effort: Pulmonary effort is normal.     Breath sounds: Normal breath sounds.  Abdominal:     General: Bowel sounds are normal.     Palpations: Abdomen is soft.  Musculoskeletal:        General: No tenderness or deformity. Normal range of motion.     Cervical back: Normal range of motion.     Comments: Marked decreased range of motion of the right shoulder. There is very little abduction and ad duction. There is very little rotation. There is no swelling or tenderness to palpation over the shoulder joint.  Lymphadenopathy:     Cervical: No cervical adenopathy.  Skin:    General: Skin is warm and dry.     Findings: No erythema or rash.  Neurological:     Mental Status: She is alert and oriented to person, place, and time.  Psychiatric:        Behavior: Behavior normal.        Thought Content: Thought content normal.        Judgment: Judgment normal.     Lab Results  Component Value Date   WBC 8.1 11/22/2020   HGB 14.7 11/22/2020   HCT  43.6 11/22/2020   MCV 98.2 11/22/2020   PLT 305 11/22/2020     Chemistry      Component Value Date/Time   NA 139 05/22/2020 0842   K 4.1 05/22/2020 0842   CL 100 05/22/2020 0842   CO2 32 05/22/2020 0842   BUN 17 05/22/2020 0842  CREATININE 0.91 05/22/2020 0842      Component Value Date/Time   CALCIUM 10.6 (H) 05/22/2020 0842   ALKPHOS 106 05/22/2020 0842   AST 16 05/22/2020 0842   ALT 17 05/22/2020 0842   BILITOT 0.5 05/22/2020 0842      Impression and Plan: Jocelyn Shaw is a 70 year old postmenopausal female.  She has a stage Ic ductal carcinoma of the left breast.  She had a lumpectomy.  She did not wish to have any radiation therapy.  We have her on Femara.  She is taking vitamin D.  When saw her 6 months ago, her vitamin D level was 68.  We will see what her calcium level is.  Otherwise, she is doing okay.  I do not have any issues with what she wants to take for the osteoporosis.  We will plan for another 50-month follow-up.   Volanda Napoleon, MD 9/7/20229:51 AM

## 2020-11-22 NOTE — Telephone Encounter (Signed)
Appts made and printed for pt per 11/22/20 los  [employer]

## 2021-01-13 ENCOUNTER — Other Ambulatory Visit: Payer: Self-pay | Admitting: Hematology & Oncology

## 2021-01-25 ENCOUNTER — Other Ambulatory Visit: Payer: Self-pay | Admitting: Hematology & Oncology

## 2021-05-24 ENCOUNTER — Inpatient Hospital Stay: Payer: Medicare HMO | Attending: Hematology & Oncology

## 2021-05-24 ENCOUNTER — Telehealth: Payer: Self-pay | Admitting: *Deleted

## 2021-05-24 ENCOUNTER — Other Ambulatory Visit: Payer: Self-pay

## 2021-05-24 ENCOUNTER — Inpatient Hospital Stay: Payer: Medicare HMO | Admitting: Hematology & Oncology

## 2021-05-24 ENCOUNTER — Encounter: Payer: Self-pay | Admitting: Hematology & Oncology

## 2021-05-24 VITALS — BP 109/61 | HR 63 | Temp 98.9°F | Resp 18 | Ht 61.02 in | Wt 189.9 lb

## 2021-05-24 DIAGNOSIS — Z170 Estrogen receptor positive status [ER+]: Secondary | ICD-10-CM | POA: Insufficient documentation

## 2021-05-24 DIAGNOSIS — C50412 Malignant neoplasm of upper-outer quadrant of left female breast: Secondary | ICD-10-CM

## 2021-05-24 DIAGNOSIS — M8589 Other specified disorders of bone density and structure, multiple sites: Secondary | ICD-10-CM | POA: Diagnosis not present

## 2021-05-24 DIAGNOSIS — C50912 Malignant neoplasm of unspecified site of left female breast: Secondary | ICD-10-CM | POA: Insufficient documentation

## 2021-05-24 DIAGNOSIS — Z79811 Long term (current) use of aromatase inhibitors: Secondary | ICD-10-CM | POA: Diagnosis not present

## 2021-05-24 LAB — CMP (CANCER CENTER ONLY)
ALT: 15 U/L (ref 0–44)
AST: 16 U/L (ref 15–41)
Albumin: 4.2 g/dL (ref 3.5–5.0)
Alkaline Phosphatase: 89 U/L (ref 38–126)
Anion gap: 8 (ref 5–15)
BUN: 19 mg/dL (ref 8–23)
CO2: 32 mmol/L (ref 22–32)
Calcium: 9.8 mg/dL (ref 8.9–10.3)
Chloride: 101 mmol/L (ref 98–111)
Creatinine: 0.96 mg/dL (ref 0.44–1.00)
GFR, Estimated: >60 mL/min (ref >60)
Glucose, Bld: 98 mg/dL (ref 70–99)
Potassium: 4.8 mmol/L (ref 3.5–5.1)
Sodium: 141 mmol/L (ref 135–145)
Total Bilirubin: 0.6 mg/dL (ref 0.3–1.2)
Total Protein: 7.1 g/dL (ref 6.5–8.1)

## 2021-05-24 LAB — CBC WITH DIFFERENTIAL (CANCER CENTER ONLY)
Abs Immature Granulocytes: 0.02 10*3/uL (ref 0.00–0.07)
Basophils Absolute: 0.1 10*3/uL (ref 0.0–0.1)
Basophils Relative: 1 %
Eosinophils Absolute: 0.2 10*3/uL (ref 0.0–0.5)
Eosinophils Relative: 3 %
HCT: 41.7 % (ref 36.0–46.0)
Hemoglobin: 14.3 g/dL (ref 12.0–15.0)
Immature Granulocytes: 0 %
Lymphocytes Relative: 22 %
Lymphs Abs: 1.6 10*3/uL (ref 0.7–4.0)
MCH: 33.8 pg (ref 26.0–34.0)
MCHC: 34.3 g/dL (ref 30.0–36.0)
MCV: 98.6 fL (ref 80.0–100.0)
Monocytes Absolute: 0.6 10*3/uL (ref 0.1–1.0)
Monocytes Relative: 8 %
Neutro Abs: 4.9 10*3/uL (ref 1.7–7.7)
Neutrophils Relative %: 66 %
Platelet Count: 264 10*3/uL (ref 150–400)
RBC: 4.23 MIL/uL (ref 3.87–5.11)
RDW: 13 % (ref 11.5–15.5)
WBC Count: 7.4 10*3/uL (ref 4.0–10.5)
nRBC: 0 % (ref 0.0–0.2)

## 2021-05-24 LAB — VITAMIN D 25 HYDROXY (VIT D DEFICIENCY, FRACTURES): Vit D, 25-Hydroxy: 53.2 ng/mL (ref 30–100)

## 2021-05-24 NOTE — Telephone Encounter (Signed)
Per 3/9 los - gave upcoming appointments - confirmed ?

## 2021-05-24 NOTE — Progress Notes (Signed)
She ?Hematology and Oncology Follow Up Visit ? ?Jocelyn Shaw ?[id number] ?11-28-1950 71 y.o. ?05/24/2021 ? ? 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 ?Current Therapy:   ?Femara 2.5 mg po q day ?    ?Interim History:  Jocelyn Shaw is back for follow-up.  Since we last saw her, she was in the hospital.  She was in overnight with RSV.  She is not sure how she got this.  She said that it took her into atrial fibrillation.  She is on blood thinner for 2 months.  She now is on baby aspirin.  I am not sure of the cardiologist ? ?The blood thinner or if she took a herself off the blood thinner. ? ?She has had no problems with cough or shortness of breath since then.  There is been no nausea or vomiting.  She has had no change in bowel or bladder habits.  She does have some frequent bowel movements. ? ?She has had no issues with rashes.  Is been no leg swelling.  She has had no headache. ? ?She is taking vitamin D. ? ?Overall, I think performance status is ECOG 1.   ? ? ?Medications:  ?Current Outpatient Medications:  ?  allopurinol (ZYLOPRIM) 300 MG tablet, Take 300 mg by mouth daily., Disp: , Rfl:  ?  amLODipine (NORVASC) 5 MG tablet, Take by mouth., Disp: , Rfl:  ?  bimatoprost (LUMIGAN) 0.01 % SOLN, nightly. One drop in each eye nightly., Disp: , Rfl:  ?  Calcium Carbonate-Vitamin D3 600-400 MG-UNIT TABS, Take by mouth daily. , Disp: , Rfl:  ?  hydrochlorothiazide (HYDRODIURIL) 12.5 MG tablet, Take by mouth daily. , Disp: , Rfl:  ?  KRILL OIL PO, daily. , Disp: , Rfl:  ?  letrozole (FEMARA) 2.5 MG tablet, TAKE 1 TABLET BY MOUTH EVERY DAY, Disp: 90 tablet, Rfl: 2 ?  meloxicam (MOBIC) 15 MG tablet, Take 1 tablet by mouth daily., Disp: , Rfl:  ?  metoprolol succinate (TOPROL-XL) 25 MG 24 hr tablet, Take 25 mg by mouth in the morning and at bedtime., Disp: , Rfl:  ?  montelukast (SINGULAIR) 10 MG tablet,  Take by mouth daily. , Disp: , Rfl:  ?  montelukast (SINGULAIR) 10 MG tablet, Take 1 tablet by mouth daily., Disp: , Rfl:  ?  Multiple Vitamin (MULTI-VITAMINS) TABS, Take by mouth daily. , Disp: , Rfl:  ?  nystatin cream (MYCOSTATIN), Apply topically 2 (two) times daily as needed., Disp: , Rfl:  ?  valACYclovir (VALTREX) 1000 MG tablet, Take by mouth daily as needed. , Disp: , Rfl:  ?  Vitamin D, Ergocalciferol, (DRISDOL) 1.25 MG (50000 UNIT) CAPS capsule, TAKE 1 CAPSULE BY MOUTH ONE TIME PER WEEK, Disp: 12 capsule, Rfl: 3 ?  albuterol (PROVENTIL) (2.5 MG/3ML) 0.083% nebulizer solution, Inhale into the lungs. (Patient not taking: Reported on 05/22/2020), Disp: , Rfl:  ?  albuterol (VENTOLIN HFA) 108 (90 Base) MCG/ACT inhaler, Inhale into the lungs. (Patient not taking: Reported on 05/24/2021), Disp: , Rfl:  ? ?Allergies:  ?Allergies  ?Allergen Reactions  ? Penicillins Swelling and Rash  ? Levofloxacin Swelling and Other (See Comments)  ?  Pt states causes joint pain ?  ? Doxycycline Hyclate Rash  ? Sulfa Antibiotics Rash  ? ? ?Past Medical History, Surgical history, Social history, and Family History were reviewed and updated. ? ?Review of Systems: ?  Review of Systems  ?Constitutional: Negative.   ?HENT:  Negative.    ?Eyes: Negative.   ?Respiratory: Negative.    ?Cardiovascular: Negative.   ?Gastrointestinal: Negative.   ?Endocrine: Negative.   ?Genitourinary: Negative.    ?Musculoskeletal: Negative.   ?Skin: Negative.   ?Neurological: Negative.   ?Hematological: Negative.   ?Psychiatric/Behavioral: Negative.    ? ?Physical Exam: ? height is 5' 1.02" (1.55 m) and weight is 189 lb 14.4 oz (86.1 kg). Her oral temperature is 98.9 ?F (37.2 ?C). Her blood pressure is 109/61 and her pulse is 63. Her respiration is 18 and oxygen saturation is 97%.  ? ?Wt Readings from Last 3 Encounters:  ?05/24/21 189 lb 14.4 oz (86.1 kg)  ?11/22/20 192 lb (87.1 kg)  ?05/22/20 189 lb 1.9 oz (85.8 kg)  ? ? ?Physical Exam ?Vitals reviewed.   ?Constitutional:   ?   Comments: Breast exam shows right breast with no masses, edema or erythema.  There is no right axillary adenopathy.  Her left breast has a lumpectomy scar at about the 11 o'clock position.  This is a very tiny area of eschar on it.  There is no surrounding erythema.  There is no fluctuance.  There is no tenderness.  She has no nipple discharge.  There is no left axillary adenopathy.  ?HENT:  ?   Head: Normocephalic and atraumatic.  ?Eyes:  ?   Pupils: Pupils are equal, round, and reactive to light.  ?Cardiovascular:  ?   Rate and Rhythm: Normal rate and regular rhythm.  ?   Heart sounds: Normal heart sounds.  ?Pulmonary:  ?   Effort: Pulmonary effort is normal.  ?   Breath sounds: Normal breath sounds.  ?Abdominal:  ?   General: Bowel sounds are normal.  ?   Palpations: Abdomen is soft.  ?Musculoskeletal:     ?   General: No tenderness or deformity. Normal range of motion.  ?   Cervical back: Normal range of motion.  ?Lymphadenopathy:  ?   Cervical: No cervical adenopathy.  ?Skin: ?   General: Skin is warm and dry.  ?   Findings: No erythema or rash.  ?Neurological:  ?   Mental Status: She is alert and oriented to person, place, and time.  ?Psychiatric:     ?   Behavior: Behavior normal.     ?   Thought Content: Thought content normal.     ?   Judgment: Judgment normal.  ? ? ? ?Lab Results  ?Component Value Date  ? WBC 7.4 05/24/2021  ? HGB 14.3 05/24/2021  ? HCT 41.7 05/24/2021  ? MCV 98.6 05/24/2021  ? PLT 264 05/24/2021  ? ?  Chemistry   ?   ?Component Value Date/Time  ? NA 141 05/24/2021 0848  ? K 4.8 05/24/2021 0848  ? CL 101 05/24/2021 0848  ? CO2 32 05/24/2021 0848  ? BUN 19 05/24/2021 0848  ? CREATININE 0.96 05/24/2021 0848  ?    ?Component Value Date/Time  ? CALCIUM 9.8 05/24/2021 0848  ? ALKPHOS 89 05/24/2021 0848  ? AST 16 05/24/2021 0848  ? ALT 15 05/24/2021 0848  ? BILITOT 0.6 05/24/2021 0848  ?  ? ? ?Impression and Plan: ?Jocelyn Shaw is a 71 year old postmenopausal female.  She  has a stage Ic ductal carcinoma of the left breast.  She had a lumpectomy.  She did not wish to have any radiation therapy.  We have her on Femara. ? ?She is due for mammogram in  April. ? ?As always, we will plan to see her back in 6 months.  I told her that the risk of her cancer coming back probably will be less than 5%. ? ? ?Volanda Napoleon, MD ?3/9/202310:28 AM ?

## 2021-10-26 ENCOUNTER — Other Ambulatory Visit: Payer: Self-pay | Admitting: Hematology & Oncology

## 2021-11-26 ENCOUNTER — Encounter: Payer: Self-pay | Admitting: Family

## 2021-11-26 ENCOUNTER — Inpatient Hospital Stay: Payer: Medicare HMO | Attending: Hematology & Oncology

## 2021-11-26 ENCOUNTER — Inpatient Hospital Stay (HOSPITAL_BASED_OUTPATIENT_CLINIC_OR_DEPARTMENT_OTHER): Payer: Medicare HMO | Admitting: Family

## 2021-11-26 VITALS — BP 139/61 | HR 54 | Temp 98.5°F | Resp 17 | Wt 188.8 lb

## 2021-11-26 DIAGNOSIS — Z170 Estrogen receptor positive status [ER+]: Secondary | ICD-10-CM

## 2021-11-26 DIAGNOSIS — Z853 Personal history of malignant neoplasm of breast: Secondary | ICD-10-CM | POA: Diagnosis present

## 2021-11-26 DIAGNOSIS — C50412 Malignant neoplasm of upper-outer quadrant of left female breast: Secondary | ICD-10-CM | POA: Diagnosis not present

## 2021-11-26 DIAGNOSIS — E559 Vitamin D deficiency, unspecified: Secondary | ICD-10-CM

## 2021-11-26 DIAGNOSIS — Z79811 Long term (current) use of aromatase inhibitors: Secondary | ICD-10-CM | POA: Insufficient documentation

## 2021-11-26 DIAGNOSIS — M8589 Other specified disorders of bone density and structure, multiple sites: Secondary | ICD-10-CM

## 2021-11-26 LAB — CBC WITH DIFFERENTIAL (CANCER CENTER ONLY)
Abs Immature Granulocytes: 0.02 10*3/uL (ref 0.00–0.07)
Basophils Absolute: 0.1 10*3/uL (ref 0.0–0.1)
Basophils Relative: 1 %
Eosinophils Absolute: 0.2 10*3/uL (ref 0.0–0.5)
Eosinophils Relative: 3 %
HCT: 41.8 % (ref 36.0–46.0)
Hemoglobin: 13.8 g/dL (ref 12.0–15.0)
Immature Granulocytes: 0 %
Lymphocytes Relative: 22 %
Lymphs Abs: 1.7 10*3/uL (ref 0.7–4.0)
MCH: 32.9 pg (ref 26.0–34.0)
MCHC: 33 g/dL (ref 30.0–36.0)
MCV: 99.8 fL (ref 80.0–100.0)
Monocytes Absolute: 0.5 10*3/uL (ref 0.1–1.0)
Monocytes Relative: 7 %
Neutro Abs: 5 10*3/uL (ref 1.7–7.7)
Neutrophils Relative %: 67 %
Platelet Count: 275 10*3/uL (ref 150–400)
RBC: 4.19 MIL/uL (ref 3.87–5.11)
RDW: 12.8 % (ref 11.5–15.5)
WBC Count: 7.5 10*3/uL (ref 4.0–10.5)
nRBC: 0 % (ref 0.0–0.2)

## 2021-11-26 LAB — CMP (CANCER CENTER ONLY)
ALT: 12 U/L (ref 0–44)
AST: 14 U/L — ABNORMAL LOW (ref 15–41)
Albumin: 4.4 g/dL (ref 3.5–5.0)
Alkaline Phosphatase: 96 U/L (ref 38–126)
Anion gap: 9 (ref 5–15)
BUN: 18 mg/dL (ref 8–23)
CO2: 32 mmol/L (ref 22–32)
Calcium: 10.6 mg/dL — ABNORMAL HIGH (ref 8.9–10.3)
Chloride: 100 mmol/L (ref 98–111)
Creatinine: 0.94 mg/dL (ref 0.44–1.00)
GFR, Estimated: >60 mL/min (ref >60)
Glucose, Bld: 101 mg/dL — ABNORMAL HIGH (ref 70–99)
Potassium: 4.2 mmol/L (ref 3.5–5.1)
Sodium: 141 mmol/L (ref 135–145)
Total Bilirubin: 0.5 mg/dL (ref 0.3–1.2)
Total Protein: 7.4 g/dL (ref 6.5–8.1)

## 2021-11-26 LAB — LACTATE DEHYDROGENASE: LDH: 131 U/L (ref 98–192)

## 2021-11-26 LAB — VITAMIN D 25 HYDROXY (VIT D DEFICIENCY, FRACTURES): Vit D, 25-Hydroxy: 66.06 ng/mL (ref 30–100)

## 2021-11-26 NOTE — Progress Notes (Signed)
Hematology and Oncology Follow Up Visit  Marlissa Emerick [id number] Aug 28, 1950 71 y.o. 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 Scarbrough is here today for follow-up. She is doing fairly well but still has issues with chronic diarrhea. She states that she has been worked up by GI and states that this has been negative so far.  She has not noted any blood loss.  No bruising or petechiae.  Bilateral breast exam was negative. No mass, lesion or rash noted.  No lymphedema or adenopathy noted on exam.  She is doing well on Femara and taking daily as prescribed.  No fever, chills, n/v, cough, rash, dizziness, SOB, chest pain, palpitations, abdominal pain or changes in bladder habits.  She has arthritis in her knees and states that she is trying to avoid knee replacements. She is currently doing regular injections which she feels have helped.  No falls or syncope reported.  Appetite and hydration have been good. Her weight is stable at 188 lbs.   ECOG Performance Status: 0 - Asymptomatic  Medications:  Allergies as of 11/26/2021       Reactions   Penicillins Swelling, Rash   Levofloxacin Swelling, Other (See Comments)   Pt states causes joint pain   Doxycycline Hyclate Rash   Sulfa Antibiotics Rash        Medication List        Accurate as of November 26, 2021  9:12 AM. If you have any questions, ask your nurse or doctor.          albuterol (2.5 MG/3ML) 0.083% nebulizer solution Commonly known as: PROVENTIL Inhale into the lungs.   albuterol 108 (90 Base) MCG/ACT inhaler Commonly known as: VENTOLIN HFA Inhale into the lungs.   allopurinol 300 MG tablet Commonly known as: ZYLOPRIM Take 300 mg by mouth daily.   amLODipine 5 MG tablet Commonly known as: NORVASC Take  by mouth.   bimatoprost 0.01 % Soln Commonly known as: LUMIGAN nightly. One drop in each eye nightly.   Calcium Carbonate-Vitamin D3 600-400 MG-UNIT Tabs Take by mouth daily.   hydrochlorothiazide 12.5 MG tablet Commonly known as: HYDRODIURIL Take by mouth daily.   KRILL OIL PO daily.   letrozole 2.5 MG tablet Commonly known as: FEMARA TAKE 1 TABLET BY MOUTH EVERY DAY   meloxicam 15 MG tablet Commonly known as: MOBIC Take 1 tablet by mouth daily.   metoprolol succinate 25 MG 24 hr tablet Commonly known as: TOPROL-XL Take 25 mg by mouth in the morning and at bedtime.   montelukast 10 MG tablet Commonly known as: SINGULAIR Take by mouth daily.   montelukast 10 MG tablet Commonly known as: SINGULAIR Take 1 tablet by mouth daily.   Multi-Vitamins Tabs Take by mouth daily.   nystatin cream Commonly known as: MYCOSTATIN Apply topically 2 (two) times daily as needed.   valACYclovir 1000 MG tablet Commonly known as: VALTREX Take by mouth daily as needed.   Vitamin D (Ergocalciferol) 1.25 MG (50000 UNIT) Caps capsule Commonly known as: DRISDOL TAKE 1 CAPSULE BY MOUTH ONE TIME PER WEEK        Allergies:  Allergies  Allergen Reactions   Penicillins Swelling and Rash   Levofloxacin Swelling and Other (See Comments)  Pt states causes joint pain    Doxycycline Hyclate Rash   Sulfa Antibiotics Rash    Past Medical History, Surgical history, Social history, and Family History were reviewed and updated.  Review of Systems: All other 10 point review of systems is negative.   Physical Exam:  vitals were not taken for this visit.   Wt Readings from Last 3 Encounters:  05/24/21 189 lb 14.4 oz (86.1 kg)  11/22/20 192 lb (87.1 kg)  05/22/20 189 lb 1.9 oz (85.8 kg)    Ocular: Sclerae unicteric, pupils equal, round and reactive to light Ear-nose-throat: Oropharynx clear, dentition fair Lymphatic: No cervical or supraclavicular adenopathy Lungs no rales or  rhonchi, good excursion bilaterally Heart regular rate and rhythm, no murmur appreciated Abd soft, nontender, positive bowel sounds MSK no focal spinal tenderness, no joint edema Neuro: non-focal, well-oriented, appropriate affect Breasts: Bilateral breast exam negative. No mass, lesion or rash noted.   Lab Results  Component Value Date   WBC 7.5 11/26/2021   HGB 13.8 11/26/2021   HCT 41.8 11/26/2021   MCV 99.8 11/26/2021   PLT 275 11/26/2021   No results found for: "FERRITIN", "IRON", "TIBC", "UIBC", "IRONPCTSAT" Lab Results  Component Value Date   RBC 4.19 11/26/2021   No results found for: "KPAFRELGTCHN", "LAMBDASER", "KAPLAMBRATIO" No results found for: "IGGSERUM", "IGA", "IGMSERUM" No results found for: "TOTALPROTELP", "ALBUMINELP", "A1GS", "A2GS", "BETS", "BETA2SER", "GAMS", "MSPIKE", "SPEI"   Chemistry      Component Value Date/Time   NA 141 05/24/2021 0848   K 4.8 05/24/2021 0848   CL 101 05/24/2021 0848   CO2 32 05/24/2021 0848   BUN 19 05/24/2021 0848   CREATININE 0.96 05/24/2021 0848      Component Value Date/Time   CALCIUM 9.8 05/24/2021 0848   ALKPHOS 89 05/24/2021 0848   AST 16 05/24/2021 0848   ALT 15 05/24/2021 0848   BILITOT 0.6 05/24/2021 0848       Impression and Plan: Ms. Prazak is a very pleasant 71 yo postmenopausal female with history of stage Ic ductal carcinoma of the left breast. She had lumpectomy and decided not to receive radiation therapy. She is now onb Femara and tolerating nicely.  She will continue her same regimen with Femara.  Mammogram earlier this year in April was negative. So far, there have been no s/s of recurrence.  She is scheduled for mammogram in April 2024.  Follow-up in 6 months.    Lottie Dawson, NP 9/11/20239:12 AM

## 2021-12-03 ENCOUNTER — Other Ambulatory Visit: Payer: Self-pay | Admitting: Hematology & Oncology

## 2022-05-27 ENCOUNTER — Inpatient Hospital Stay: Payer: Medicare HMO | Attending: Hematology & Oncology

## 2022-05-27 ENCOUNTER — Inpatient Hospital Stay: Payer: Medicare HMO | Admitting: Family

## 2022-05-27 VITALS — BP 136/61 | HR 68 | Resp 16 | Wt 181.8 lb

## 2022-05-27 DIAGNOSIS — C50412 Malignant neoplasm of upper-outer quadrant of left female breast: Secondary | ICD-10-CM | POA: Diagnosis not present

## 2022-05-27 DIAGNOSIS — C50912 Malignant neoplasm of unspecified site of left female breast: Secondary | ICD-10-CM | POA: Diagnosis present

## 2022-05-27 DIAGNOSIS — Z170 Estrogen receptor positive status [ER+]: Secondary | ICD-10-CM

## 2022-05-27 DIAGNOSIS — M8589 Other specified disorders of bone density and structure, multiple sites: Secondary | ICD-10-CM | POA: Diagnosis not present

## 2022-05-27 DIAGNOSIS — E559 Vitamin D deficiency, unspecified: Secondary | ICD-10-CM | POA: Diagnosis not present

## 2022-05-27 DIAGNOSIS — Z79811 Long term (current) use of aromatase inhibitors: Secondary | ICD-10-CM | POA: Insufficient documentation

## 2022-05-27 LAB — VITAMIN D 25 HYDROXY (VIT D DEFICIENCY, FRACTURES): Vit D, 25-Hydroxy: 72.54 ng/mL (ref 30–100)

## 2022-05-27 LAB — CBC WITH DIFFERENTIAL (CANCER CENTER ONLY)
Abs Immature Granulocytes: 0.09 10*3/uL — ABNORMAL HIGH (ref 0.00–0.07)
Basophils Absolute: 0.1 10*3/uL (ref 0.0–0.1)
Basophils Relative: 1 %
Eosinophils Absolute: 0.5 10*3/uL (ref 0.0–0.5)
Eosinophils Relative: 6 %
HCT: 42.9 % (ref 36.0–46.0)
Hemoglobin: 14.2 g/dL (ref 12.0–15.0)
Immature Granulocytes: 1 %
Lymphocytes Relative: 21 %
Lymphs Abs: 1.8 10*3/uL (ref 0.7–4.0)
MCH: 32.9 pg (ref 26.0–34.0)
MCHC: 33.1 g/dL (ref 30.0–36.0)
MCV: 99.5 fL (ref 80.0–100.0)
Monocytes Absolute: 0.6 10*3/uL (ref 0.1–1.0)
Monocytes Relative: 8 %
Neutro Abs: 5.2 10*3/uL (ref 1.7–7.7)
Neutrophils Relative %: 63 %
Platelet Count: 264 10*3/uL (ref 150–400)
RBC: 4.31 MIL/uL (ref 3.87–5.11)
RDW: 13.7 % (ref 11.5–15.5)
WBC Count: 8.2 10*3/uL (ref 4.0–10.5)
nRBC: 0 % (ref 0.0–0.2)

## 2022-05-27 LAB — CMP (CANCER CENTER ONLY)
ALT: 14 U/L (ref 0–44)
AST: 16 U/L (ref 15–41)
Albumin: 4.6 g/dL (ref 3.5–5.0)
Alkaline Phosphatase: 85 U/L (ref 38–126)
Anion gap: 10 (ref 5–15)
BUN: 21 mg/dL (ref 8–23)
CO2: 30 mmol/L (ref 22–32)
Calcium: 10.5 mg/dL — ABNORMAL HIGH (ref 8.9–10.3)
Chloride: 100 mmol/L (ref 98–111)
Creatinine: 1 mg/dL (ref 0.44–1.00)
GFR, Estimated: 60 mL/min — ABNORMAL LOW (ref >60)
Glucose, Bld: 107 mg/dL — ABNORMAL HIGH (ref 70–99)
Potassium: 4.2 mmol/L (ref 3.5–5.1)
Sodium: 140 mmol/L (ref 135–145)
Total Bilirubin: 0.6 mg/dL (ref 0.3–1.2)
Total Protein: 8 g/dL (ref 6.5–8.1)

## 2022-05-27 LAB — LACTATE DEHYDROGENASE: LDH: 126 U/L (ref 98–192)

## 2022-05-27 NOTE — Progress Notes (Signed)
Hematology and Oncology Follow Up Visit  Jocelyn Shaw [id number] 1950/09/20 72 y.o. 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 She is doing well but notes fatigue at times.  Bilateral breast exam negative. No mass, lesion or rash noted.  She has what feels like a fatty lipoma on her left upper back and 2 moles she plans to discuss with dermatology.  No adenopathy or lymphedema noted on exam.  No fever, chills, cough, rash, SOB, chest pain, palpitations, abdominal pain or changes in bowel or bladder habits. She has chronic diarrhea  No bleeding, bruising or petechiae.  No swelling, tenderness, numbness or tingling in her extremities.  No falls or syncope reported.  Appetite and hydration are good. Weight is stable at 181 lbs.   ECOG Performance Status: 1 - Symptomatic but completely ambulatory  Medications:  Allergies as of 05/27/2022       Reactions   Penicillins Swelling, Rash   Levofloxacin Swelling, Other (See Comments)   Pt states causes joint pain   Doxycycline Hyclate Rash   Sulfa Antibiotics Rash   Z-pak [azithromycin] Rash        Medication List        Accurate as of May 27, 2022 10:20 AM. If you have any questions, ask your nurse or doctor.          STOP taking these medications    hydrochlorothiazide 12.5 MG tablet Commonly known as: HYDRODIURIL Stopped by: Lottie Dawson, NP   montelukast 10 MG tablet Commonly known as: SINGULAIR Stopped by: Lottie Dawson, NP       TAKE these medications    albuterol (2.5 MG/3ML) 0.083% nebulizer solution Commonly known as: PROVENTIL Inhale into the lungs.   albuterol 108 (90 Base) MCG/ACT inhaler Commonly known as: VENTOLIN HFA Inhale into the lungs.   allopurinol  300 MG tablet Commonly known as: ZYLOPRIM Take 300 mg by mouth daily.   amLODipine 5 MG tablet Commonly known as: NORVASC Take by mouth.   bimatoprost 0.01 % Soln Commonly known as: LUMIGAN nightly. One drop in each eye nightly.   Calcium Carbonate-Vitamin D3 600-400 MG-UNIT Tabs Take by mouth daily.   KRILL OIL PO daily.   letrozole 2.5 MG tablet Commonly known as: FEMARA TAKE 1 TABLET BY MOUTH EVERY DAY   losartan-hydrochlorothiazide 100-25 MG tablet Commonly known as: HYZAAR Take 1 tablet by mouth daily.   melatonin 5 MG Tabs Take 5 mg by mouth at bedtime as needed.   meloxicam 15 MG tablet Commonly known as: MOBIC Take 1 tablet by mouth daily.   metoprolol succinate 25 MG 24 hr tablet Commonly known as: TOPROL-XL Take 25 mg by mouth in the morning and at bedtime.   Multi-Vitamins Tabs Take by mouth daily.   nystatin cream Commonly known as: MYCOSTATIN Apply topically 2 (two) times daily as needed.   valACYclovir 1000 MG tablet Commonly known as: VALTREX Take by mouth daily as needed.   Vitamin D (Ergocalciferol) 1.25 MG (50000 UNIT) Caps capsule Commonly known as: DRISDOL TAKE 1 CAPSULE BY MOUTH ONE TIME PER WEEK        Allergies:  Allergies  Allergen Reactions   Penicillins Swelling and Rash   Levofloxacin Swelling and  Other (See Comments)    Pt states causes joint pain    Doxycycline Hyclate Rash   Sulfa Antibiotics Rash   Z-Pak [Azithromycin] Rash    Past Medical History, Surgical history, Social history, and Family History were reviewed and updated.  Review of Systems: All other 10 point review of systems is negative.   Physical Exam:  weight is 181 lb 12.8 oz (82.5 kg). Her blood pressure is 136/61 and her pulse is 68. Her respiration is 16 and oxygen saturation is 93%.   Wt Readings from Last 3 Encounters:  05/27/22 181 lb 12.8 oz (82.5 kg)  11/26/21 188 lb 12.8 oz (85.6 kg)  05/24/21 189 lb 14.4 oz (86.1 kg)    Ocular:  Sclerae unicteric, pupils equal, round and reactive to light Ear-nose-throat: Oropharynx clear, dentition fair Lymphatic: No cervical, supraclavicular or axillary adenopathy Lungs no rales or rhonchi, good excursion bilaterally Heart regular rate and rhythm, no murmur appreciated Abd soft, nontender, positive bowel sounds MSK no focal spinal tenderness, no joint edema Neuro: non-focal, well-oriented, appropriate affect Breasts: Same as above.   Lab Results  Component Value Date   WBC 8.2 05/27/2022   HGB 14.2 05/27/2022   HCT 42.9 05/27/2022   MCV 99.5 05/27/2022   PLT 264 05/27/2022   No results found for: "FERRITIN", "IRON", "TIBC", "UIBC", "IRONPCTSAT" Lab Results  Component Value Date   RBC 4.31 05/27/2022   No results found for: "KPAFRELGTCHN", "LAMBDASER", "KAPLAMBRATIO" No results found for: "IGGSERUM", "IGA", "IGMSERUM" No results found for: "TOTALPROTELP", "ALBUMINELP", "A1GS", "A2GS", "BETS", "BETA2SER", "GAMS", "MSPIKE", "SPEI"   Chemistry      Component Value Date/Time   NA 140 05/27/2022 0916   K 4.2 05/27/2022 0916   CL 100 05/27/2022 0916   CO2 30 05/27/2022 0916   BUN 21 05/27/2022 0916   CREATININE 1.00 05/27/2022 0916      Component Value Date/Time   CALCIUM 10.5 (H) 05/27/2022 0916   ALKPHOS 85 05/27/2022 0916   AST 16 05/27/2022 0916   ALT 14 05/27/2022 0916   BILITOT 0.6 05/27/2022 0916       Impression and Plan: Jocelyn Shaw is a very pleasant 72 yo postmenopausal female with history of stage Ic ductal carcinoma of the left breast. She had lumpectomy and decided not to receive radiation therapy. She is now on Femara and tolerating nicely.  She will continue her same regimen with Femara.  So far, there have been no s/s of recurrence.  She is scheduled for mammogram in April 2024.  Follow-up in 6 months.    Lottie Dawson, NP 3/11/202410:20 AM

## 2022-07-15 ENCOUNTER — Other Ambulatory Visit: Payer: Self-pay | Admitting: Hematology & Oncology

## 2022-09-24 ENCOUNTER — Other Ambulatory Visit: Payer: Self-pay | Admitting: Hematology & Oncology

## 2022-11-27 ENCOUNTER — Encounter: Payer: Self-pay | Admitting: Hematology & Oncology

## 2022-11-27 ENCOUNTER — Inpatient Hospital Stay: Payer: Medicare HMO | Admitting: Hematology & Oncology

## 2022-11-27 ENCOUNTER — Inpatient Hospital Stay: Payer: Medicare HMO | Attending: Family

## 2022-11-27 VITALS — BP 135/55 | HR 57 | Temp 98.3°F | Resp 20 | Ht 61.0 in | Wt 180.0 lb

## 2022-11-27 DIAGNOSIS — C50912 Malignant neoplasm of unspecified site of left female breast: Secondary | ICD-10-CM | POA: Diagnosis present

## 2022-11-27 DIAGNOSIS — M8589 Other specified disorders of bone density and structure, multiple sites: Secondary | ICD-10-CM

## 2022-11-27 DIAGNOSIS — Z79811 Long term (current) use of aromatase inhibitors: Secondary | ICD-10-CM | POA: Diagnosis not present

## 2022-11-27 DIAGNOSIS — Z170 Estrogen receptor positive status [ER+]: Secondary | ICD-10-CM

## 2022-11-27 DIAGNOSIS — E559 Vitamin D deficiency, unspecified: Secondary | ICD-10-CM

## 2022-11-27 DIAGNOSIS — C50412 Malignant neoplasm of upper-outer quadrant of left female breast: Secondary | ICD-10-CM | POA: Diagnosis not present

## 2022-11-27 LAB — CMP (CANCER CENTER ONLY)
ALT: 15 U/L (ref 0–44)
AST: 16 U/L (ref 15–41)
Albumin: 4.6 g/dL (ref 3.5–5.0)
Alkaline Phosphatase: 93 U/L (ref 38–126)
Anion gap: 7 (ref 5–15)
BUN: 20 mg/dL (ref 8–23)
CO2: 32 mmol/L (ref 22–32)
Calcium: 10.7 mg/dL — ABNORMAL HIGH (ref 8.9–10.3)
Chloride: 101 mmol/L (ref 98–111)
Creatinine: 1.1 mg/dL — ABNORMAL HIGH (ref 0.44–1.00)
GFR, Estimated: 53 mL/min — ABNORMAL LOW (ref >60)
Glucose, Bld: 98 mg/dL (ref 70–99)
Potassium: 4.2 mmol/L (ref 3.5–5.1)
Sodium: 140 mmol/L (ref 135–145)
Total Bilirubin: 0.4 mg/dL (ref 0.3–1.2)
Total Protein: 7.5 g/dL (ref 6.5–8.1)

## 2022-11-27 LAB — CBC WITH DIFFERENTIAL (CANCER CENTER ONLY)
Abs Immature Granulocytes: 0.02 10*3/uL (ref 0.00–0.07)
Basophils Absolute: 0.1 10*3/uL (ref 0.0–0.1)
Basophils Relative: 1 %
Eosinophils Absolute: 0.3 10*3/uL (ref 0.0–0.5)
Eosinophils Relative: 4 %
HCT: 39.9 % (ref 36.0–46.0)
Hemoglobin: 13.3 g/dL (ref 12.0–15.0)
Immature Granulocytes: 0 %
Lymphocytes Relative: 23 %
Lymphs Abs: 1.8 10*3/uL (ref 0.7–4.0)
MCH: 33.3 pg (ref 26.0–34.0)
MCHC: 33.3 g/dL (ref 30.0–36.0)
MCV: 100 fL (ref 80.0–100.0)
Monocytes Absolute: 0.6 10*3/uL (ref 0.1–1.0)
Monocytes Relative: 8 %
Neutro Abs: 5 10*3/uL (ref 1.7–7.7)
Neutrophils Relative %: 64 %
Platelet Count: 257 10*3/uL (ref 150–400)
RBC: 3.99 MIL/uL (ref 3.87–5.11)
RDW: 13 % (ref 11.5–15.5)
WBC Count: 7.8 10*3/uL (ref 4.0–10.5)
nRBC: 0 % (ref 0.0–0.2)

## 2022-11-27 LAB — VITAMIN D 25 HYDROXY (VIT D DEFICIENCY, FRACTURES): Vit D, 25-Hydroxy: 79.3 ng/mL (ref 30–100)

## 2022-11-27 LAB — LACTATE DEHYDROGENASE: LDH: 125 U/L (ref 98–192)

## 2022-11-27 NOTE — Progress Notes (Signed)
Hematology and Oncology Follow Up Visit  Jocelyn Shaw [id number] July 02, 1950 72 y.o. 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 Elizondo is here today for follow-up.  We see her every 6 months.  She is doing quite well.  She has had no complaints since we last saw her.  She is busy working.  She often works on the weekends..  She has had no problems with the Femara.  There is been no arthralgias or myalgias.  She has had no issues with COVID.Marland Kitchen  There is been no change in bowel or bladder habits.  She has had no nausea or vomiting.  Is been no rashes.  Is been no bleeding.  Overall, I would say that her performance status is probably ECOG 0.    Medications:  Allergies as of 11/27/2022       Reactions   Penicillins Swelling, Rash   Levofloxacin Swelling, Other (See Comments)   Pt states causes joint pain   Doxycycline Hyclate Rash   Sulfa Antibiotics Rash   Z-pak [azithromycin] Rash        Medication List        Accurate as of November 27, 2022  9:36 AM. If you have any questions, ask your nurse or doctor.          albuterol (2.5 MG/3ML) 0.083% nebulizer solution Commonly known as: PROVENTIL Inhale into the lungs.   albuterol 108 (90 Base) MCG/ACT inhaler Commonly known as: VENTOLIN HFA Inhale into the lungs.   allopurinol 300 MG tablet Commonly known as: ZYLOPRIM Take 300 mg by mouth daily.   amLODipine 5 MG tablet Commonly known as: NORVASC Take by mouth daily.   bimatoprost 0.01 % Soln Commonly known as: LUMIGAN nightly. One drop in each eye nightly.   Calcium Carbonate-Vitamin D3 600-400 MG-UNIT Tabs Take by mouth daily.   chlorhexidine 0.12 % solution Commonly known as: PERIDEX 10 mLs 2 (two) times daily.   KRILL OIL PO daily.   letrozole 2.5  MG tablet Commonly known as: FEMARA TAKE 1 TABLET BY MOUTH EVERY DAY   losartan-hydrochlorothiazide 100-25 MG tablet Commonly known as: HYZAAR Take 1 tablet by mouth daily.   melatonin 5 MG Tabs Take 5 mg by mouth at bedtime as needed.   meloxicam 15 MG tablet Commonly known as: MOBIC Take 1 tablet by mouth daily.   metoprolol succinate 25 MG 24 hr tablet Commonly known as: TOPROL-XL Take 25 mg by mouth in the morning and at bedtime.   montelukast 10 MG tablet Commonly known as: SINGULAIR TAKE 1 TABLET BY MOUTH EVERY DAY AT NIGHT   Multi-Vitamins Tabs Take by mouth daily.   nystatin cream Commonly known as: MYCOSTATIN Apply topically 2 (two) times daily as needed.   valACYclovir 1000 MG tablet Commonly known as: VALTREX Take by mouth daily as needed.   Vitamin D (Ergocalciferol) 1.25 MG (50000 UNIT) Caps capsule Commonly known as: DRISDOL TAKE 1 CAPSULE BY MOUTH ONE TIME PER WEEK        Allergies:  Allergies  Allergen Reactions   Penicillins Swelling and Rash   Levofloxacin Swelling and Other (See Comments)    Pt states causes joint pain    Doxycycline Hyclate Rash   Sulfa Antibiotics Rash  Z-Pak [Azithromycin] Rash    Past Medical History, Surgical history, Social history, and Family History were reviewed and updated.  Review of Systems: Review of Systems  Constitutional: Negative.   HENT: Negative.    Eyes: Negative.   Respiratory: Negative.    Cardiovascular: Negative.   Gastrointestinal: Negative.   Genitourinary: Negative.   Musculoskeletal: Negative.   Skin: Negative.   Neurological: Negative.   Endo/Heme/Allergies: Negative.   Psychiatric/Behavioral: Negative.       Physical Exam:  height is 5\' 1"  (1.549 m) and weight is 180 lb 0.6 oz (81.7 kg). Her oral temperature is 98.3 F (36.8 C). Her blood pressure is 135/55 (abnormal) and her pulse is 57 (abnormal). Her respiration is 20 and oxygen saturation is 96%.   Wt Readings from Last  3 Encounters:  11/27/22 180 lb 0.6 oz (81.7 kg)  05/27/22 181 lb 12.8 oz (82.5 kg)  11/26/21 188 lb 12.8 oz (85.6 kg)    Physical Exam Vitals reviewed.  Constitutional:      Comments: Breast exam shows right breast with no masses, edema or erythema.  There is no right axillary adenopathy.  Left breast shows well-healed lumpectomy at about the 9 o'clock position.  She has no masses, erythema or warmth in the left breast.  She has a well-healed left lymphadenectomy scar in the axilla.  HENT:     Head: Normocephalic and atraumatic.  Eyes:     Pupils: Pupils are equal, round, and reactive to light.  Cardiovascular:     Rate and Rhythm: Normal rate and regular rhythm.     Heart sounds: Normal heart sounds.  Pulmonary:     Effort: Pulmonary effort is normal.     Breath sounds: Normal breath sounds.  Abdominal:     General: Bowel sounds are normal.     Palpations: Abdomen is soft.  Musculoskeletal:        General: No tenderness or deformity. Normal range of motion.     Cervical back: Normal range of motion.  Lymphadenopathy:     Cervical: No cervical adenopathy.  Skin:    General: Skin is warm and dry.     Findings: No erythema or rash.  Neurological:     Mental Status: She is alert and oriented to person, place, and time.  Psychiatric:        Behavior: Behavior normal.        Thought Content: Thought content normal.        Judgment: Judgment normal.      Lab Results  Component Value Date   WBC 7.8 11/27/2022   HGB 13.3 11/27/2022   HCT 39.9 11/27/2022   MCV 100.0 11/27/2022   PLT 257 11/27/2022   No results found for: "FERRITIN", "IRON", "TIBC", "UIBC", "IRONPCTSAT" Lab Results  Component Value Date   RBC 3.99 11/27/2022   No results found for: "KPAFRELGTCHN", "LAMBDASER", "KAPLAMBRATIO" No results found for: "IGGSERUM", "IGA", "IGMSERUM" No results found for: "TOTALPROTELP", "ALBUMINELP", "A1GS", "A2GS", "BETS", "BETA2SER", "GAMS", "MSPIKE", "SPEI"   Chemistry       Component Value Date/Time   NA 140 11/27/2022 0842   K 4.2 11/27/2022 0842   CL 101 11/27/2022 0842   CO2 32 11/27/2022 0842   BUN 20 11/27/2022 0842   CREATININE 1.10 (H) 11/27/2022 0842      Component Value Date/Time   CALCIUM 10.7 (H) 11/27/2022 0842   ALKPHOS 93 11/27/2022 0842   AST 16 11/27/2022 0842   ALT 15 11/27/2022 0842   BILITOT 0.4  11/27/2022 0842       Impression and Plan: Ms. Moulin is a very pleasant 72 yo postmenopausal female with history of stage Ic ductal carcinoma of the left breast. She had lumpectomy and decided not to receive radiation therapy. She is now on Femara and tolerating nicely.   She will continue her same regimen with Femara.  I probably would have her on Femara for 7 years.  We will plan to get her back in another 6 months. Josph Macho, MD 9/11/20249:36 AM

## 2023-04-15 ENCOUNTER — Other Ambulatory Visit: Payer: Self-pay | Admitting: Hematology & Oncology

## 2023-05-27 ENCOUNTER — Encounter: Payer: Self-pay | Admitting: Family

## 2023-05-27 ENCOUNTER — Inpatient Hospital Stay (HOSPITAL_BASED_OUTPATIENT_CLINIC_OR_DEPARTMENT_OTHER): Payer: Medicare HMO | Admitting: Family

## 2023-05-27 ENCOUNTER — Inpatient Hospital Stay: Payer: Medicare HMO | Attending: Hematology & Oncology

## 2023-05-27 VITALS — BP 131/58 | HR 57 | Temp 98.2°F | Resp 17 | Wt 183.0 lb

## 2023-05-27 DIAGNOSIS — E559 Vitamin D deficiency, unspecified: Secondary | ICD-10-CM

## 2023-05-27 DIAGNOSIS — M8580 Other specified disorders of bone density and structure, unspecified site: Secondary | ICD-10-CM | POA: Insufficient documentation

## 2023-05-27 DIAGNOSIS — Z1721 Progesterone receptor positive status: Secondary | ICD-10-CM | POA: Diagnosis not present

## 2023-05-27 DIAGNOSIS — C50412 Malignant neoplasm of upper-outer quadrant of left female breast: Secondary | ICD-10-CM | POA: Diagnosis not present

## 2023-05-27 DIAGNOSIS — M8589 Other specified disorders of bone density and structure, multiple sites: Secondary | ICD-10-CM | POA: Diagnosis not present

## 2023-05-27 DIAGNOSIS — Z170 Estrogen receptor positive status [ER+]: Secondary | ICD-10-CM | POA: Diagnosis not present

## 2023-05-27 DIAGNOSIS — C50912 Malignant neoplasm of unspecified site of left female breast: Secondary | ICD-10-CM | POA: Insufficient documentation

## 2023-05-27 DIAGNOSIS — Z1732 Human epidermal growth factor receptor 2 negative status: Secondary | ICD-10-CM | POA: Insufficient documentation

## 2023-05-27 DIAGNOSIS — Z79811 Long term (current) use of aromatase inhibitors: Secondary | ICD-10-CM | POA: Insufficient documentation

## 2023-05-27 LAB — CBC WITH DIFFERENTIAL (CANCER CENTER ONLY)
Abs Immature Granulocytes: 0.01 10*3/uL (ref 0.00–0.07)
Basophils Absolute: 0.1 10*3/uL (ref 0.0–0.1)
Basophils Relative: 1 %
Eosinophils Absolute: 0.4 10*3/uL (ref 0.0–0.5)
Eosinophils Relative: 5 %
HCT: 38 % (ref 36.0–46.0)
Hemoglobin: 12.6 g/dL (ref 12.0–15.0)
Immature Granulocytes: 0 %
Lymphocytes Relative: 26 %
Lymphs Abs: 2 10*3/uL (ref 0.7–4.0)
MCH: 33.7 pg (ref 26.0–34.0)
MCHC: 33.2 g/dL (ref 30.0–36.0)
MCV: 101.6 fL — ABNORMAL HIGH (ref 80.0–100.0)
Monocytes Absolute: 0.7 10*3/uL (ref 0.1–1.0)
Monocytes Relative: 9 %
Neutro Abs: 4.7 10*3/uL (ref 1.7–7.7)
Neutrophils Relative %: 59 %
Platelet Count: 261 10*3/uL (ref 150–400)
RBC: 3.74 MIL/uL — ABNORMAL LOW (ref 3.87–5.11)
RDW: 13.4 % (ref 11.5–15.5)
WBC Count: 7.8 10*3/uL (ref 4.0–10.5)
nRBC: 0 % (ref 0.0–0.2)

## 2023-05-27 LAB — CMP (CANCER CENTER ONLY)
ALT: 14 U/L (ref 0–44)
AST: 15 U/L (ref 15–41)
Albumin: 4.4 g/dL (ref 3.5–5.0)
Alkaline Phosphatase: 78 U/L (ref 38–126)
Anion gap: 9 (ref 5–15)
BUN: 23 mg/dL (ref 8–23)
CO2: 28 mmol/L (ref 22–32)
Calcium: 10.2 mg/dL (ref 8.9–10.3)
Chloride: 101 mmol/L (ref 98–111)
Creatinine: 1.29 mg/dL — ABNORMAL HIGH (ref 0.44–1.00)
GFR, Estimated: 44 mL/min — ABNORMAL LOW (ref >60)
Glucose, Bld: 102 mg/dL — ABNORMAL HIGH (ref 70–99)
Potassium: 4.1 mmol/L (ref 3.5–5.1)
Sodium: 138 mmol/L (ref 135–145)
Total Bilirubin: 0.6 mg/dL (ref 0.0–1.2)
Total Protein: 7.4 g/dL (ref 6.5–8.1)

## 2023-05-27 LAB — VITAMIN D 25 HYDROXY (VIT D DEFICIENCY, FRACTURES): Vit D, 25-Hydroxy: 84.87 ng/mL (ref 30–100)

## 2023-05-27 LAB — LACTATE DEHYDROGENASE: LDH: 126 U/L (ref 98–192)

## 2023-05-27 NOTE — Progress Notes (Signed)
 Hematology and Oncology Follow Up Visit  Jocelyn Shaw [id number] [birth year] 73 y.o. 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 She is doing well but does note joint aches and pains especially in the knees.  Mild swelling in her ankles that comes and goes with activity.  No falls or syncope reported.  She is taking her Femara daily as prescribed.  No changes in self breast exam per patient. No mass, lesion or rash.  No adenopathy or lymphedema noted on today's exam.  She is scheduled for her annual mammogram next month.  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  Appetite is improving after having a stomach virus two weeks ago.  She is doing her best to stay well hydrated. Weight is stable at 183 lbs.   ECOG Performance Status: 0 - Asymptomatic  Medications:  Allergies as of 05/27/2023       Reactions   Penicillins Swelling, Rash   Levofloxacin Swelling, Other (See Comments)   Pt states causes joint pain   Doxycycline Hyclate Rash   Sulfa Antibiotics Rash   Z-pak [azithromycin] Rash        Medication List        Accurate as of May 27, 2023  9:20 AM. If you have any questions, ask your nurse or doctor.          albuterol (2.5 MG/3ML) 0.083% nebulizer solution Commonly known as: PROVENTIL Take 2.5 mg by nebulization every 4 (four) hours as needed.   albuterol 108 (90 Base) MCG/ACT inhaler Commonly known as: VENTOLIN HFA Inhale 1-2 puffs into the lungs every 6 (six) hours as needed.   allopurinol 300 MG tablet Commonly known as: ZYLOPRIM Take 300  mg by mouth daily.   amLODipine 5 MG tablet Commonly known as: NORVASC Take by mouth daily.   aspirin EC 81 MG tablet Take 81 mg by mouth daily. Swallow whole.   bimatoprost 0.01 % Soln Commonly known as: LUMIGAN nightly. One drop in each eye nightly.   Calcium Carbonate-Vitamin D3 600-400 MG-UNIT Tabs Take by mouth daily.   chlorhexidine 0.12 % solution Commonly known as: PERIDEX 10 mLs 2 (two) times daily.   KRILL OIL PO daily.   letrozole 2.5 MG tablet Commonly known as: FEMARA TAKE 1 TABLET BY MOUTH EVERY DAY   losartan-hydrochlorothiazide 100-25 MG tablet Commonly known as: HYZAAR Take 1 tablet by mouth daily.   melatonin 5 MG Tabs Take 5 mg by mouth at bedtime as needed.   meloxicam 15 MG tablet Commonly known as: MOBIC Take 1 tablet by mouth daily. PRN   metoprolol succinate 25 MG 24 hr tablet Commonly known as: TOPROL-XL Take 25 mg by mouth in the morning and at bedtime.   montelukast 10 MG tablet Commonly known as: SINGULAIR TAKE 1 TABLET BY MOUTH EVERY DAY AT NIGHT   Multi-Vitamins Tabs Take by mouth daily.   nystatin cream Commonly known as: MYCOSTATIN Apply topically 2 (two) times daily as needed.   valACYclovir 1000 MG tablet Commonly known as: VALTREX Take by mouth daily as needed.  Vitamin D (Ergocalciferol) 1.25 MG (50000 UNIT) Caps capsule Commonly known as: DRISDOL TAKE 1 CAPSULE BY MOUTH ONE TIME PER WEEK        Allergies:  Allergies  Allergen Reactions   Penicillins Swelling and Rash   Levofloxacin Swelling and Other (See Comments)    Pt states causes joint pain    Doxycycline Hyclate Rash   Sulfa Antibiotics Rash   Z-Pak [Azithromycin] Rash    Past Medical History, Surgical history, Social history, and Family History were reviewed and updated.  Review of Systems: All other 10 point review of systems is negative.   Physical Exam:  weight is 183 lb (83 kg). Her oral temperature is 98.2 F (36.8 C). Her blood  pressure is 131/58 (abnormal) and her pulse is 57 (abnormal). Her respiration is 17 and oxygen saturation is 97%.   Wt Readings from Last 3 Encounters:  05/27/23 183 lb (83 kg)  11/27/22 180 lb 0.6 oz (81.7 kg)  05/27/22 181 lb 12.8 oz (82.5 kg)    Ocular: Sclerae unicteric, pupils equal, round and reactive to light Ear-nose-throat: Oropharynx clear, dentition fair Lymphatic: No cervical or supraclavicular adenopathy Lungs no rales or rhonchi, good excursion bilaterally Heart regular rate and rhythm, no murmur appreciated Abd soft, nontender, positive bowel sounds MSK no focal spinal tenderness, no joint edema Neuro: non-focal, well-oriented, appropriate affect Breasts: Same as above.   Lab Results  Component Value Date   WBC 7.8 05/27/2023   HGB 12.6 05/27/2023   HCT 38.0 05/27/2023   MCV 101.6 (H) 05/27/2023   PLT 261 05/27/2023   No results found for: "FERRITIN", "IRON", "TIBC", "UIBC", "IRONPCTSAT" Lab Results  Component Value Date   RBC 3.74 (L) 05/27/2023   No results found for: "KPAFRELGTCHN", "LAMBDASER", "KAPLAMBRATIO" No results found for: "IGGSERUM", "IGA", "IGMSERUM" No results found for: "TOTALPROTELP", "ALBUMINELP", "A1GS", "A2GS", "BETS", "BETA2SER", "GAMS", "MSPIKE", "SPEI"   Chemistry      Component Value Date/Time   NA 140 11/27/2022 0842   K 4.2 11/27/2022 0842   CL 101 11/27/2022 0842   CO2 32 11/27/2022 0842   BUN 20 11/27/2022 0842   CREATININE 1.10 (H) 11/27/2022 0842      Component Value Date/Time   CALCIUM 10.7 (H) 11/27/2022 0842   ALKPHOS 93 11/27/2022 0842   AST 16 11/27/2022 0842   ALT 15 11/27/2022 0842   BILITOT 0.4 11/27/2022 0842       Impression and Plan: Jocelyn Shaw is a very pleasant 73 yo postmenopausal female with history of stage Ic ductal carcinoma of the left breast. She had lumpectomy and decided not to receive radiation therapy. She is now on Femara and tolerating nicely.  She will complete her 7 years of Femara in  05/2025.  Osteopenia noted on bone density scan in 10/2022. She is taking vitamin D and calcium supplements daily.  No changes indicated at this time.  Follow-up in 6 months.    Eileen Stanford, NP 3/11/20259:20 AM

## 2023-08-26 ENCOUNTER — Other Ambulatory Visit: Payer: Self-pay | Admitting: Hematology & Oncology

## 2023-11-25 ENCOUNTER — Inpatient Hospital Stay (HOSPITAL_BASED_OUTPATIENT_CLINIC_OR_DEPARTMENT_OTHER): Admitting: Family

## 2023-11-25 ENCOUNTER — Inpatient Hospital Stay: Attending: Hematology & Oncology

## 2023-11-25 ENCOUNTER — Encounter: Payer: Self-pay | Admitting: Family

## 2023-11-25 VITALS — BP 132/61 | HR 64 | Temp 98.5°F | Resp 17 | Wt 182.1 lb

## 2023-11-25 DIAGNOSIS — Z1721 Progesterone receptor positive status: Secondary | ICD-10-CM | POA: Diagnosis not present

## 2023-11-25 DIAGNOSIS — M8580 Other specified disorders of bone density and structure, unspecified site: Secondary | ICD-10-CM | POA: Diagnosis not present

## 2023-11-25 DIAGNOSIS — Z1732 Human epidermal growth factor receptor 2 negative status: Secondary | ICD-10-CM | POA: Diagnosis not present

## 2023-11-25 DIAGNOSIS — C50912 Malignant neoplasm of unspecified site of left female breast: Secondary | ICD-10-CM | POA: Insufficient documentation

## 2023-11-25 DIAGNOSIS — Z79811 Long term (current) use of aromatase inhibitors: Secondary | ICD-10-CM | POA: Insufficient documentation

## 2023-11-25 DIAGNOSIS — Z170 Estrogen receptor positive status [ER+]: Secondary | ICD-10-CM | POA: Diagnosis not present

## 2023-11-25 DIAGNOSIS — C50412 Malignant neoplasm of upper-outer quadrant of left female breast: Secondary | ICD-10-CM

## 2023-11-25 DIAGNOSIS — E559 Vitamin D deficiency, unspecified: Secondary | ICD-10-CM

## 2023-11-25 DIAGNOSIS — M8589 Other specified disorders of bone density and structure, multiple sites: Secondary | ICD-10-CM

## 2023-11-25 LAB — CBC WITH DIFFERENTIAL (CANCER CENTER ONLY)
Abs Immature Granulocytes: 0.02 K/uL (ref 0.00–0.07)
Basophils Absolute: 0.1 K/uL (ref 0.0–0.1)
Basophils Relative: 1 %
Eosinophils Absolute: 0.2 K/uL (ref 0.0–0.5)
Eosinophils Relative: 3 %
HCT: 40.4 % (ref 36.0–46.0)
Hemoglobin: 13.8 g/dL (ref 12.0–15.0)
Immature Granulocytes: 0 %
Lymphocytes Relative: 22 %
Lymphs Abs: 1.6 K/uL (ref 0.7–4.0)
MCH: 34.1 pg — ABNORMAL HIGH (ref 26.0–34.0)
MCHC: 34.2 g/dL (ref 30.0–36.0)
MCV: 99.8 fL (ref 80.0–100.0)
Monocytes Absolute: 0.6 K/uL (ref 0.1–1.0)
Monocytes Relative: 8 %
Neutro Abs: 4.6 K/uL (ref 1.7–7.7)
Neutrophils Relative %: 66 %
Platelet Count: 259 K/uL (ref 150–400)
RBC: 4.05 MIL/uL (ref 3.87–5.11)
RDW: 13.6 % (ref 11.5–15.5)
WBC Count: 7 K/uL (ref 4.0–10.5)
nRBC: 0 % (ref 0.0–0.2)

## 2023-11-25 LAB — CMP (CANCER CENTER ONLY)
ALT: 17 U/L (ref 0–44)
AST: 20 U/L (ref 15–41)
Albumin: 4.8 g/dL (ref 3.5–5.0)
Alkaline Phosphatase: 98 U/L (ref 38–126)
Anion gap: 13 (ref 5–15)
BUN: 18 mg/dL (ref 8–23)
CO2: 28 mmol/L (ref 22–32)
Calcium: 11.1 mg/dL — ABNORMAL HIGH (ref 8.9–10.3)
Chloride: 100 mmol/L (ref 98–111)
Creatinine: 1.21 mg/dL — ABNORMAL HIGH (ref 0.44–1.00)
GFR, Estimated: 47 mL/min — ABNORMAL LOW (ref >60)
Glucose, Bld: 107 mg/dL — ABNORMAL HIGH (ref 70–99)
Potassium: 4.7 mmol/L (ref 3.5–5.1)
Sodium: 141 mmol/L (ref 135–145)
Total Bilirubin: 0.5 mg/dL (ref 0.0–1.2)
Total Protein: 8.1 g/dL (ref 6.5–8.1)

## 2023-11-25 LAB — VITAMIN D 25 HYDROXY (VIT D DEFICIENCY, FRACTURES): Vit D, 25-Hydroxy: 76.52 ng/mL (ref 30–100)

## 2023-11-25 LAB — LACTATE DEHYDROGENASE: LDH: 196 U/L — ABNORMAL HIGH (ref 98–192)

## 2023-11-25 NOTE — Progress Notes (Signed)
 Hematology and Oncology Follow Up Visit  Jocelyn Shaw [id number] Sep 29, 1950 73 y.o. 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 She is doing fairly well. She (unknowingly) tore her left achilles tendon and is in a boot. She states that she has to wear this for 3 weeks and then ortho will re-evaluate whether or not she needs surgery.  No falls or syncope. She has not noted any swelling in her extremities.  Tingling in the toes comes and goes.  She states that nephrology took her off calcium and Mobic due to elevated calcium and chronic kidney insufficiency stage 3. Calcium today is 11.1.  Bilateral breast exam was negative. No mass, lesion or rash noted.  No adenopathy or lymphedema noted.  No fever, chills, n/v, cough, rash, dizziness, SOB, chest pain, palpitations, abdominal pain or changes in bowel or bladder habits.  She has hot flashes and night sweats with Femara . She is taking as prescribed. She has generalized joint aches and pains that wax and wane.  Appetite and hydration are good. Weight is stable at 182 lbs.   ECOG Performance Status: 1 - Symptomatic but completely ambulatory  Medications:  Allergies as of 11/25/2023       Reactions   Penicillins Swelling, Rash   Levofloxacin Swelling, Other (See Comments)   Pt states causes joint pain   Doxycycline Hyclate Rash   Sulfa Antibiotics Rash   Z-pak [azithromycin] Rash        Medication List        Accurate as of November 25, 2023  8:52 AM. If you have any questions, ask your nurse or doctor.          albuterol (2.5 MG/3ML) 0.083% nebulizer solution Commonly known as: PROVENTIL Take 2.5 mg by nebulization every 4 (four) hours as needed.    albuterol 108 (90 Base) MCG/ACT inhaler Commonly known as: VENTOLIN HFA Inhale 1-2 puffs into the lungs every 6 (six) hours as needed.   allopurinol 300 MG tablet Commonly known as: ZYLOPRIM Take 300 mg by mouth daily.   amLODipine 5 MG tablet Commonly known as: NORVASC Take by mouth daily.   aspirin EC 81 MG tablet Take 81 mg by mouth daily. Swallow whole.   bimatoprost 0.01 % Soln Commonly known as: LUMIGAN nightly. One drop in each eye nightly.   Calcium Carbonate-Vitamin D3 600-400 MG-UNIT Tabs Take by mouth daily.   chlorhexidine 0.12 % solution Commonly known as: PERIDEX 10 mLs 2 (two) times daily.   KRILL OIL PO daily.   letrozole  2.5 MG tablet Commonly known as: FEMARA  TAKE 1 TABLET BY MOUTH EVERY DAY   losartan-hydrochlorothiazide 100-25 MG tablet Commonly known as: HYZAAR Take 1 tablet by mouth daily.   melatonin 5 MG Tabs Take 5 mg by mouth at bedtime as needed.   meloxicam 15 MG tablet Commonly known as: MOBIC Take 1 tablet by mouth daily. PRN   metoprolol succinate 25 MG 24 hr tablet Commonly known as: TOPROL-XL Take 25 mg by mouth in the morning and at bedtime.   montelukast 10 MG tablet Commonly known as: SINGULAIR TAKE 1 TABLET BY MOUTH EVERY DAY AT NIGHT   Multi-Vitamins Tabs Take by mouth daily.  nystatin cream Commonly known as: MYCOSTATIN Apply topically 2 (two) times daily as needed.   valACYclovir 1000 MG tablet Commonly known as: VALTREX Take by mouth daily as needed.   Vitamin D  (Ergocalciferol ) 1.25 MG (50000 UNIT) Caps capsule Commonly known as: DRISDOL  TAKE 1 CAPSULE BY MOUTH ONE TIME PER WEEK        Allergies:  Allergies  Allergen Reactions   Penicillins Swelling and Rash   Levofloxacin Swelling and Other (See Comments)    Pt states causes joint pain    Doxycycline Hyclate Rash   Sulfa Antibiotics Rash   Z-Pak [Azithromycin] Rash    Past Medical History, Surgical history, Social history, and Family  History were reviewed and updated.  Review of Systems: All other 10 point review of systems is negative.   Physical Exam:  vitals were not taken for this visit.   Wt Readings from Last 3 Encounters:  05/27/23 183 lb (83 kg)  11/27/22 180 lb 0.6 oz (81.7 kg)  05/27/22 181 lb 12.8 oz (82.5 kg)    Ocular: Sclerae unicteric, pupils equal, round and reactive to light Ear-nose-throat: Oropharynx clear, dentition fair Lymphatic: No cervical, supraclavicular or axillary adenopathy Lungs no rales or rhonchi, good excursion bilaterally Heart regular rate and rhythm, no murmur appreciated Abd soft, nontender, positive bowel sounds MSK no focal spinal tenderness, no joint edema Neuro: non-focal, well-oriented, appropriate affect Breasts: Same as above  Lab Results  Component Value Date   WBC 7.0 11/25/2023   HGB 13.8 11/25/2023   HCT 40.4 11/25/2023   MCV 99.8 11/25/2023   PLT 259 11/25/2023   No results found for: FERRITIN, IRON, TIBC, UIBC, IRONPCTSAT Lab Results  Component Value Date   RBC 4.05 11/25/2023   No results found for: KPAFRELGTCHN, LAMBDASER, KAPLAMBRATIO No results found for: IGGSERUM, IGA, IGMSERUM No results found for: STEPHANY CARLOTA BENSON MARKEL EARLA JOANNIE DOC VICK, SPEI   Chemistry      Component Value Date/Time   NA 138 05/27/2023 0844   K 4.1 05/27/2023 0844   CL 101 05/27/2023 0844   CO2 28 05/27/2023 0844   BUN 23 05/27/2023 0844   CREATININE 1.29 (H) 05/27/2023 0844      Component Value Date/Time   CALCIUM 10.2 05/27/2023 0844   ALKPHOS 78 05/27/2023 0844   AST 15 05/27/2023 0844   ALT 14 05/27/2023 0844   BILITOT 0.6 05/27/2023 0844       Impression and Plan:  Jocelyn Shaw is a very pleasant 73 yo postmenopausal female with history of stage Ic ductal carcinoma of the left breast. She had lumpectomy and decided not to receive radiation therapy. She is now on Femara  and tolerating nicely.   She will complete her 7 years of Femara  in 05/2025.  Osteopenia noted on bone density scan in 10/2022. She is taking vitamin D  daily. Will repeat scan in 10/2024 unless otherwise indicated.  Follow-up in 6 months.    Lauraine Pepper, NP 9/9/20258:52 AM

## 2024-01-04 ENCOUNTER — Other Ambulatory Visit: Payer: Self-pay | Admitting: Hematology & Oncology

## 2024-02-05 ENCOUNTER — Other Ambulatory Visit: Payer: Self-pay | Admitting: Hematology & Oncology

## 2024-02-05 MED ORDER — CEFDINIR 300 MG PO CAPS: 600.0000 mg | ORAL_CAPSULE | Freq: Every day | ORAL | 0 refills | Status: AC

## 2024-02-10 ENCOUNTER — Encounter: Payer: Self-pay | Admitting: Hematology & Oncology

## 2024-02-10 ENCOUNTER — Inpatient Hospital Stay: Admitting: Hematology & Oncology

## 2024-02-10 ENCOUNTER — Inpatient Hospital Stay: Attending: Hematology & Oncology

## 2024-02-10 VITALS — BP 132/77 | HR 70 | Temp 98.2°F | Resp 18 | Ht 61.02 in | Wt 182.1 lb

## 2024-02-10 DIAGNOSIS — Z1732 Human epidermal growth factor receptor 2 negative status: Secondary | ICD-10-CM | POA: Diagnosis not present

## 2024-02-10 DIAGNOSIS — Z170 Estrogen receptor positive status [ER+]: Secondary | ICD-10-CM

## 2024-02-10 DIAGNOSIS — R918 Other nonspecific abnormal finding of lung field: Secondary | ICD-10-CM

## 2024-02-10 DIAGNOSIS — Z1721 Progesterone receptor positive status: Secondary | ICD-10-CM | POA: Insufficient documentation

## 2024-02-10 DIAGNOSIS — Z79811 Long term (current) use of aromatase inhibitors: Secondary | ICD-10-CM | POA: Diagnosis not present

## 2024-02-10 DIAGNOSIS — C50412 Malignant neoplasm of upper-outer quadrant of left female breast: Secondary | ICD-10-CM

## 2024-02-10 DIAGNOSIS — C50912 Malignant neoplasm of unspecified site of left female breast: Secondary | ICD-10-CM | POA: Diagnosis present

## 2024-02-10 DIAGNOSIS — E559 Vitamin D deficiency, unspecified: Secondary | ICD-10-CM

## 2024-02-10 LAB — CBC WITH DIFFERENTIAL (CANCER CENTER ONLY)
Abs Immature Granulocytes: 0.02 K/uL (ref 0.00–0.07)
Basophils Absolute: 0.1 K/uL (ref 0.0–0.1)
Basophils Relative: 1 %
Eosinophils Absolute: 0.2 K/uL (ref 0.0–0.5)
Eosinophils Relative: 2 %
HCT: 38 % (ref 36.0–46.0)
Hemoglobin: 12.7 g/dL (ref 12.0–15.0)
Immature Granulocytes: 0 %
Lymphocytes Relative: 25 %
Lymphs Abs: 1.6 K/uL (ref 0.7–4.0)
MCH: 33.7 pg (ref 26.0–34.0)
MCHC: 33.4 g/dL (ref 30.0–36.0)
MCV: 100.8 fL — ABNORMAL HIGH (ref 80.0–100.0)
Monocytes Absolute: 0.5 K/uL (ref 0.1–1.0)
Monocytes Relative: 8 %
Neutro Abs: 4.1 K/uL (ref 1.7–7.7)
Neutrophils Relative %: 64 %
Platelet Count: 274 K/uL (ref 150–400)
RBC: 3.77 MIL/uL — ABNORMAL LOW (ref 3.87–5.11)
RDW: 14 % (ref 11.5–15.5)
WBC Count: 6.5 K/uL (ref 4.0–10.5)
nRBC: 0 % (ref 0.0–0.2)

## 2024-02-10 LAB — CMP (CANCER CENTER ONLY)
ALT: 20 U/L (ref 0–44)
AST: 25 U/L (ref 15–41)
Albumin: 4.5 g/dL (ref 3.5–5.0)
Alkaline Phosphatase: 96 U/L (ref 38–126)
Anion gap: 10 (ref 5–15)
BUN: 27 mg/dL — ABNORMAL HIGH (ref 8–23)
CO2: 28 mmol/L (ref 22–32)
Calcium: 10.1 mg/dL (ref 8.9–10.3)
Chloride: 103 mmol/L (ref 98–111)
Creatinine: 1.23 mg/dL — ABNORMAL HIGH (ref 0.44–1.00)
GFR, Estimated: 46 mL/min — ABNORMAL LOW (ref >60)
Glucose, Bld: 117 mg/dL — ABNORMAL HIGH (ref 70–99)
Potassium: 4.2 mmol/L (ref 3.5–5.1)
Sodium: 141 mmol/L (ref 135–145)
Total Bilirubin: 0.5 mg/dL (ref 0.0–1.2)
Total Protein: 7.4 g/dL (ref 6.5–8.1)

## 2024-02-10 LAB — LACTATE DEHYDROGENASE: LDH: 165 U/L (ref 105–235)

## 2024-02-10 LAB — VITAMIN D 25 HYDROXY (VIT D DEFICIENCY, FRACTURES): Vit D, 25-Hydroxy: 90.31 ng/mL (ref 30–100)

## 2024-02-10 NOTE — Progress Notes (Signed)
 Hematology and Oncology Follow Up Visit  Jocelyn Shaw [id number] 01/18/1951 73 y.o. 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  She has a long history of asthma.  She has never smoked.  She apparently had a CT scan of the chest that was done 2 weeks ago.  This showed multiple small pulmonary nodules that were nonspecific.  Because of this, she is back here now.  She was told that she had to come back to see us  because of her history of breast cancer..  She is on Femara .  She has early-stage breast cancer.  She did have a cough.  She had pneumonia.  I think this is premature cleared up..  There is been no fever.  She has had no weight loss.  She has had no nausea or vomiting.  She has had no change in bowel or bladder habits.  There is been no rashes.  She has had no bleeding.  Overall, I would have to say that her performance status is quite good at [facility]  Medications:  Allergies as of 02/10/2024       Reactions   Penicillins Swelling, Rash   Levofloxacin Swelling, Other (See Comments)   Pt states causes joint pain   Doxycycline Hyclate Rash   Sulfa Antibiotics Rash   Z-pak [azithromycin] Rash        Medication List        Accurate as of February 10, 2024  8:55 AM. If you have any questions, ask your nurse or doctor.          STOP taking these medications    aspirin EC 81 MG tablet Stopped by: Cena Bruhn R Virdell Hoiland   Calcium Carbonate-Vitamin D3 600-400 MG-UNIT Tabs Stopped by: Maude JONELLE Crease   meloxicam 15 MG tablet Commonly known as: MOBIC Stopped by: Laurel Harnden R Ebonique Hallstrom       TAKE these medications    albuterol (2.5 MG/3ML) 0.083% nebulizer solution Commonly known as: PROVENTIL Take  2.5 mg by nebulization every 4 (four) hours as needed.   albuterol 108 (90 Base) MCG/ACT inhaler Commonly known as: VENTOLIN HFA Inhale 1-2 puffs into the lungs every 6 (six) hours as needed.   allopurinol 300 MG tablet Commonly known as: ZYLOPRIM Take 300 mg by mouth daily.   amLODipine 5 MG tablet Commonly known as: NORVASC Take by mouth daily.   apixaban 2.5 MG Tabs tablet Commonly known as: ELIQUIS Take 2.5 mg by mouth 2 (two) times daily.   bimatoprost 0.01 % Soln Commonly known as: LUMIGAN nightly. One drop in each eye nightly.   cefdinir  300 MG capsule Commonly known as: OMNICEF  Take 2 capsules (600 mg total) by mouth daily.   chlorhexidine 0.12 % solution Commonly known as: PERIDEX 10 mLs 2 (two) times daily.   KRILL OIL PO daily.   letrozole  2.5 MG tablet Commonly known as: FEMARA  TAKE 1 TABLET BY MOUTH EVERY DAY   losartan-hydrochlorothiazide 100-25 MG tablet Commonly known as: HYZAAR Take 1 tablet by mouth daily.   melatonin 5 MG Tabs Take 5 mg by mouth at bedtime as needed.   metoprolol succinate 25 MG 24 hr tablet Commonly known as: TOPROL-XL  Take 25 mg by mouth in the morning and at bedtime.   montelukast 10 MG tablet Commonly known as: SINGULAIR TAKE 1 TABLET BY MOUTH EVERY DAY AT NIGHT   Multi-Vitamins Tabs Take by mouth daily.   nystatin cream Commonly known as: MYCOSTATIN Apply topically 2 (two) times daily as needed.   valACYclovir 1000 MG tablet Commonly known as: VALTREX Take by mouth daily as needed.   Vitamin D  (Ergocalciferol ) 1.25 MG (50000 UNIT) Caps capsule Commonly known as: DRISDOL  TAKE 1 CAPSULE BY MOUTH ONE TIME PER WEEK        Allergies:  Allergies  Allergen Reactions   Penicillins Swelling and Rash   Levofloxacin Swelling and Other (See Comments)    Pt states causes joint pain    Doxycycline Hyclate Rash   Sulfa Antibiotics Rash   Z-Pak [Azithromycin] Rash    Past Medical History, Surgical history,  Social history, and Family History were reviewed and updated.  Review of Systems:  Review of Systems  Constitutional: Negative.   HENT: Negative.    Eyes: Negative.   Respiratory:  Positive for cough and wheezing.   Cardiovascular: Negative.   Gastrointestinal: Negative.   Genitourinary: Negative.   Musculoskeletal: Negative.   Skin: Negative.   Neurological: Negative.   Endo/Heme/Allergies: Negative.   Psychiatric/Behavioral: Negative.     Jocelyn Shaw   Physical Exam:  height is 5' 1.02 (1.55 m) and weight is 182 lb 1.3 oz (82.6 kg). Her oral temperature is 98.2 F (36.8 C). Her blood pressure is 132/77 and her pulse is 70. Her respiration is 18 and oxygen saturation is 98%.   Wt Readings from Last 3 Encounters:  02/10/24 182 lb 1.3 oz (82.6 kg)  11/25/23 182 lb 1.3 oz (82.6 kg)  05/27/23 183 lb (83 kg)    Physical Exam Vitals reviewed.  HENT:     Head: Normocephalic and atraumatic.  Eyes:     Pupils: Pupils are equal, round, and reactive to light.  Cardiovascular:     Rate and Rhythm: Normal rate and regular rhythm.     Heart sounds: Normal heart sounds.     Comments: Cardiac exam shows a regular rate and rhythm.  She may have an occasional extra beat.  She has 1/6 systolic ejection murmur. Pulmonary:     Effort: Pulmonary effort is normal.     Breath sounds: Normal breath sounds.     Comments: Her lungs sound clear bilaterally.  I do not hear any wheezes.  She has no rales.  She has good breath sounds bilaterally. Abdominal:     General: Bowel sounds are normal.     Palpations: Abdomen is soft.  Musculoskeletal:        General: No tenderness or deformity. Normal range of motion.     Cervical back: Normal range of motion.  Lymphadenopathy:     Cervical: No cervical adenopathy.  Skin:    General: Skin is warm and dry.     Findings: No erythema or rash.  Neurological:     Mental Status: She is alert and oriented to person, place, and time.  Psychiatric:         Behavior: Behavior normal.        Thought Content: Thought content normal.        Judgment: Judgment normal.      Lab Results  Component Value Date   WBC 6.5 02/10/2024   HGB 12.7 02/10/2024   HCT 38.0 02/10/2024   MCV 100.8 (H) 02/10/2024  PLT 274 02/10/2024   No results found for: FERRITIN, IRON, TIBC, UIBC, IRONPCTSAT Lab Results  Component Value Date   RBC 3.77 (L) 02/10/2024   No results found for: KPAFRELGTCHN, LAMBDASER, KAPLAMBRATIO No results found for: IGGSERUM, IGA, IGMSERUM No results found for: STEPHANY CARLOTA BENSON MARKEL EARLA JOANNIE DOC VICK, SPEI   Chemistry      Component Value Date/Time   NA 141 11/25/2023 0841   K 4.7 11/25/2023 0841   CL 100 11/25/2023 0841   CO2 28 11/25/2023 0841   BUN 18 11/25/2023 0841   CREATININE 1.21 (H) 11/25/2023 0841      Component Value Date/Time   CALCIUM 11.1 (H) 11/25/2023 0841   ALKPHOS 98 11/25/2023 0841   AST 20 11/25/2023 0841   ALT 17 11/25/2023 0841   BILITOT 0.5 11/25/2023 0841       Impression and Plan:  Jocelyn Shaw is a very pleasant 73 yo postmenopausal female with history of stage Ic ductal carcinoma of the left breast.   I am not sure what these lung nodules represent.  Again she had early-stage breast cancer.  She is on Femara .  I would have to believe that these are not related to breast cancer..  She has never smoked.  However, I suppose she could certainly have a malignancy.  I think what we have to do is do CT scan in January.  Lets give her enough time to have any changes that might be notable.  I long talk with Jocelyn Shaw.  She feels good.  She is going go to [place] with her family for Thanksgiving.  I want heard to have a wonderful time.  I will see her back in the middle of January.  Will get a CT scan the same day that I see her.    Maude JONELLE Crease, MD 11/25/20258:55 AM

## 2024-02-11 LAB — CANCER ANTIGEN 27.29: CA 27.29: 35.1 U/mL (ref 0.0–38.6)

## 2024-04-06 ENCOUNTER — Ambulatory Visit (HOSPITAL_BASED_OUTPATIENT_CLINIC_OR_DEPARTMENT_OTHER)
Admission: RE | Admit: 2024-04-06 | Discharge: 2024-04-06 | Disposition: A | Discharge status: Home / Self Care | Source: Ambulatory Visit | Attending: Hematology & Oncology | Admitting: Hematology & Oncology

## 2024-04-06 DIAGNOSIS — R918 Other nonspecific abnormal finding of lung field: Secondary | ICD-10-CM | POA: Diagnosis present

## 2024-04-16 ENCOUNTER — Other Ambulatory Visit: Payer: Self-pay | Admitting: Hematology & Oncology

## 2024-04-16 DIAGNOSIS — R918 Other nonspecific abnormal finding of lung field: Secondary | ICD-10-CM

## 2024-04-16 DIAGNOSIS — C50412 Malignant neoplasm of upper-outer quadrant of left female breast: Secondary | ICD-10-CM

## 2024-04-27 ENCOUNTER — Encounter (HOSPITAL_COMMUNITY)

## 2024-05-24 ENCOUNTER — Ambulatory Visit: Admitting: Family

## 2024-05-24 ENCOUNTER — Inpatient Hospital Stay
